# Patient Record
Sex: Female | Born: 1990 | Race: White | Hispanic: No
Health system: Western US, Academic
[De-identification: ages and names within clinical notes are randomized; demographics above are authoritative.]

## PROBLEM LIST (undated history)

## (undated) DIAGNOSIS — M79609 Pain in unspecified limb: Secondary | ICD-10-CM

## (undated) DIAGNOSIS — H6690 Otitis media, unspecified, unspecified ear: Secondary | ICD-10-CM

## (undated) DIAGNOSIS — Z8739 Personal history of other diseases of the musculoskeletal system and connective tissue: Secondary | ICD-10-CM

## (undated) DIAGNOSIS — T1490XA Injury, unspecified, initial encounter: Secondary | ICD-10-CM

## (undated) DIAGNOSIS — R42 Dizziness and giddiness: Secondary | ICD-10-CM

## (undated) DIAGNOSIS — M542 Cervicalgia: Secondary | ICD-10-CM

## (undated) DIAGNOSIS — M719 Bursopathy, unspecified: Secondary | ICD-10-CM

## (undated) DIAGNOSIS — R519 Headache, unspecified: Secondary | ICD-10-CM

## (undated) DIAGNOSIS — S79919A Unspecified injury of unspecified hip, initial encounter: Secondary | ICD-10-CM

## (undated) DIAGNOSIS — M1990 Unspecified osteoarthritis, unspecified site: Secondary | ICD-10-CM

## (undated) DIAGNOSIS — N939 Abnormal uterine and vaginal bleeding, unspecified: Secondary | ICD-10-CM

## (undated) DIAGNOSIS — Z87828 Personal history of other (healed) physical injury and trauma: Secondary | ICD-10-CM

## (undated) DIAGNOSIS — F419 Anxiety disorder, unspecified: Secondary | ICD-10-CM

## (undated) DIAGNOSIS — M2550 Pain in unspecified joint: Secondary | ICD-10-CM

## (undated) DIAGNOSIS — F4310 Post-traumatic stress disorder, unspecified: Secondary | ICD-10-CM

## (undated) DIAGNOSIS — R109 Unspecified abdominal pain: Secondary | ICD-10-CM

## (undated) DIAGNOSIS — G629 Polyneuropathy, unspecified: Secondary | ICD-10-CM

## (undated) DIAGNOSIS — IMO0002 Reserved for concepts with insufficient information to code with codable children: Secondary | ICD-10-CM

## (undated) DIAGNOSIS — M259 Joint disorder, unspecified: Secondary | ICD-10-CM

## (undated) DIAGNOSIS — J020 Streptococcal pharyngitis: Secondary | ICD-10-CM

## (undated) DIAGNOSIS — G8929 Other chronic pain: Secondary | ICD-10-CM

## (undated) DIAGNOSIS — M26609 Unspecified temporomandibular joint disorder, unspecified side: Secondary | ICD-10-CM

## (undated) DIAGNOSIS — H9319 Tinnitus, unspecified ear: Secondary | ICD-10-CM

## (undated) DIAGNOSIS — F32A Depression, unspecified: Secondary | ICD-10-CM

## (undated) DIAGNOSIS — M5127 Other intervertebral disc displacement, lumbosacral region: Secondary | ICD-10-CM

## (undated) DIAGNOSIS — N809 Endometriosis, unspecified: Secondary | ICD-10-CM

## (undated) DIAGNOSIS — N83209 Unspecified ovarian cyst, unspecified side: Secondary | ICD-10-CM

## (undated) HISTORY — DX: Personal history of other (healed) physical injury and trauma: Z87.828

## (undated) HISTORY — DX: Cervicalgia: M54.2

## (undated) HISTORY — PX: TRIGGER POINT INJECTION: SHX5117

## (undated) HISTORY — DX: Bursopathy, unspecified: M71.9

## (undated) HISTORY — DX: Other chronic pain: G89.29

## (undated) HISTORY — DX: Tinnitus, unspecified ear: H93.19

## (undated) HISTORY — PX: ORTHOPEDIC SURGERY: SHX5033

## (undated) HISTORY — DX: Personal history of other diseases of the musculoskeletal system and connective tissue: Z87.39

## (undated) HISTORY — DX: Pain in unspecified limb: M79.609

## (undated) HISTORY — DX: Dizziness and giddiness: R42

## (undated) HISTORY — PX: WISDOM TOOTH EXTRACTION: SHX5011

## (undated) HISTORY — DX: Otitis media, unspecified, unspecified ear: H66.90

## (undated) HISTORY — DX: Unspecified osteoarthritis, unspecified site: M19.90

## (undated) HISTORY — DX: Endometriosis, unspecified: N80.9

## (undated) HISTORY — DX: Depression, unspecified: F32.A

## (undated) HISTORY — DX: Unspecified temporomandibular joint disorder, unspecified side: M26.609

## (undated) HISTORY — DX: Polyneuropathy, unspecified: G62.9

## (undated) HISTORY — DX: Streptococcal pharyngitis: J02.0

## (undated) HISTORY — DX: Post-traumatic stress disorder, unspecified: F43.10

## (undated) HISTORY — DX: Joint disorder, unspecified: M25.9

## (undated) HISTORY — DX: Pain in unspecified joint: M25.50

## (undated) HISTORY — DX: Reserved for concepts with insufficient information to code with codable children: IMO0002

## (undated) HISTORY — DX: Other intervertebral disc displacement, lumbosacral region: M51.27

## (undated) HISTORY — DX: Headache, unspecified: R51.9

## (undated) HISTORY — DX: Unspecified ovarian cyst, unspecified side: N83.209

## (undated) HISTORY — DX: Unspecified injury of unspecified hip, initial encounter: S79.919A

## (undated) HISTORY — PX: NO PRIOR SURGERIES: 100

## (undated) HISTORY — PX: PR UNLISTED PROCEDURE PELVIS/HIP JOINT: 27299

## (undated) HISTORY — DX: Unspecified abdominal pain: R10.9

## (undated) HISTORY — DX: Abnormal uterine and vaginal bleeding, unspecified: N93.9

## (undated) HISTORY — DX: Anxiety disorder, unspecified: F41.9

## (undated) HISTORY — PX: PR ADENOIDECTOMY PRIMARY <AGE 12: 42830

## (undated) HISTORY — DX: Injury, unspecified, initial encounter: T14.90XA

## (undated) HISTORY — PX: PR TYMPANOSTOMY GENERAL ANESTHESIA: 69436

---

## 2018-10-13 ENCOUNTER — Ambulatory Visit: Payer: Self-pay

## 2018-11-29 DIAGNOSIS — M25552 Pain in left hip: Secondary | ICD-10-CM | POA: Insufficient documentation

## 2018-12-05 ENCOUNTER — Ambulatory Visit: Payer: Self-pay

## 2019-01-09 HISTORY — PX: HC ARTHROSCOPY HIP W/LABRAL REPAIR: 04901651

## 2019-01-30 DIAGNOSIS — M25559 Pain in unspecified hip: Secondary | ICD-10-CM | POA: Insufficient documentation

## 2019-01-30 DIAGNOSIS — M24159 Other articular cartilage disorders, unspecified hip: Secondary | ICD-10-CM | POA: Insufficient documentation

## 2020-10-30 DIAGNOSIS — M4727 Other spondylosis with radiculopathy, lumbosacral region: Secondary | ICD-10-CM | POA: Insufficient documentation

## 2020-12-03 DIAGNOSIS — M47816 Spondylosis without myelopathy or radiculopathy, lumbar region: Secondary | ICD-10-CM | POA: Insufficient documentation

## 2021-02-14 ENCOUNTER — Ambulatory Visit: Payer: Self-pay

## 2021-08-10 HISTORY — PX: MOUTH SURGERY: SHX715

## 2021-11-05 DIAGNOSIS — M545 Low back pain, unspecified: Secondary | ICD-10-CM | POA: Insufficient documentation

## 2021-11-05 DIAGNOSIS — N941 Unspecified dyspareunia: Secondary | ICD-10-CM | POA: Insufficient documentation

## 2021-11-05 DIAGNOSIS — R102 Pelvic and perineal pain: Secondary | ICD-10-CM | POA: Insufficient documentation

## 2021-11-06 ENCOUNTER — Telehealth (HOSPITAL_BASED_OUTPATIENT_CLINIC_OR_DEPARTMENT_OTHER): Payer: Self-pay | Admitting: Gynecology

## 2021-11-06 NOTE — Telephone Encounter (Signed)
New referral. Outside records requested

## 2021-11-10 ENCOUNTER — Telehealth (HOSPITAL_BASED_OUTPATIENT_CLINIC_OR_DEPARTMENT_OTHER): Payer: Self-pay

## 2021-11-10 NOTE — Telephone Encounter (Signed)
RETURN CALL: Voicemail - Detailed Message      SUBJECT:  Appointment Request     REASON FOR VISIT: 1cm mass on L buccal mucosa x4 mo  PREFERRED DATE/TIME: 1st Available   ADDITIONAL INFORMATION: ccr unable to appoint no scheduling instructions in referral, please call

## 2021-11-12 ENCOUNTER — Ambulatory Visit (HOSPITAL_BASED_OUTPATIENT_CLINIC_OR_DEPARTMENT_OTHER): Admitting: Physical Medicine & Rehabilitation

## 2021-11-15 ENCOUNTER — Encounter (HOSPITAL_BASED_OUTPATIENT_CLINIC_OR_DEPARTMENT_OTHER): Payer: Self-pay

## 2021-11-18 ENCOUNTER — Ambulatory Visit: Attending: Family Medicine | Admitting: Family Medicine

## 2021-11-18 ENCOUNTER — Encounter (HOSPITAL_BASED_OUTPATIENT_CLINIC_OR_DEPARTMENT_OTHER): Payer: Self-pay | Admitting: Family Medicine

## 2021-11-18 ENCOUNTER — Other Ambulatory Visit (HOSPITAL_COMMUNITY): Payer: Self-pay

## 2021-11-18 VITALS — BP 113/77 | HR 88 | Temp 98.1°F | Ht 66.54 in | Wt 178.0 lb

## 2021-11-18 DIAGNOSIS — G5703 Lesion of sciatic nerve, bilateral lower limbs: Secondary | ICD-10-CM | POA: Insufficient documentation

## 2021-11-18 DIAGNOSIS — M533 Sacrococcygeal disorders, not elsewhere classified: Secondary | ICD-10-CM | POA: Insufficient documentation

## 2021-11-18 DIAGNOSIS — M5459 Other low back pain: Secondary | ICD-10-CM | POA: Insufficient documentation

## 2021-11-18 DIAGNOSIS — M67959 Unspecified disorder of synovium and tendon, unspecified thigh: Secondary | ICD-10-CM | POA: Insufficient documentation

## 2021-11-18 DIAGNOSIS — M419 Scoliosis, unspecified: Secondary | ICD-10-CM | POA: Insufficient documentation

## 2021-11-18 NOTE — Patient Instructions (Signed)
Physical Therapy Locations:     [place]    [facility]   [address] [place] [address]  Phone: [phone]    [facility]   [address] [place] [address]  Phone: [phone]    [facility]  [address] [place] [address]  Phone: [phone]    [place]     [facility]  [address] [place] [address]  Phone: [phone]    [facility]   [address]  [place] [address]  Phone: [phone]    [facility]   [address]  [place] [address]    [place]     [facility]   [address] [place] [address]  Phone: [phone]

## 2021-11-18 NOTE — Progress Notes (Unsigned)
[facility] NOTE    ID & CHIEF COMPLAINT:   Patient: Christina Brooks  Date: 11/18/2021  Referred by: Dr. Outside   Primary Care Physician:  No primary care provider on file.    Chief Complaint   Patient presents with   . Lower Back - Pain       HISTORY:   Christina Brooks is a 31 year old female who is presenting with low back pain without acute injury but started years ago after being in the [employer]. Her pain stars in the low back and shoots down her leg into her toes on occasion. Pain worse with movement or activity. Sitting for extended periods of time also painful. She had lumbar MRI 2020 that showed herniated disc of L5-S1. MRI 10/2020 and SPECT scan 02/2021 that showed arthritis. Also has imaging in 2018 that showed scoliosis of spine. More recently any basic movement is ramping up pain. She has done PT for years with some improvements but not much. Treatments have included PT, oral steroid, injections into the spine but didn't help, gabapentin. She has not done PT in approximately 6 months. Has seen spine specialist with in [place]. Has been putting off surgery. Had SI joint injection last year that was very helpful. Went away after injection but recently started noticing it again. Also has history of PTSD and tries not to take medications.      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 Helped with the hip a lot.     Activities and exercise include walking. Medically retired from [employer]    Past relevant orthopedic history: I reviewed the patient's chart and history, and patient has no other orthopedic history relevant to today's encounter.    Past Medical History: I have reviewed and confirmed the past medical history in the chart.  No past medical history on file.    Past Surgical History: I have reviewed and confirmed the past surgical history in the chart.  No past surgical history on file.    Medications: Reviewed medication list in the chart.  No outpatient medications prior to visit.      No facility-administered medications prior to visit.       Allergies: Reviewed allergy section in the chart.  Review of patient's allergies indicates:  Not on File    Review of Systems: A 14-point review of systems was performed.  With the exception of the HPI, all other review of systems was negative.       PHYSICAL EXAM:   Vitals: BP 113/77   Pulse 88   Temp 36.7 C   Ht 5' 6.54" (1.69 m)   Wt 80.7 kg (178 lb)   SpO2 99%   BMI 28.27 kg/m  Body mass index is 28.27 kg/m.  General: NAD, pleasant & cooperative  Head: EOMI, no facial lesions  MSK:  Back Exam:  (Focus on bilateral lumbar back; the physical exam is normal on contralateral side except when noted)    Inspection:   No deformity, asymmetry, atrophy, redness, or ecchymosis. + paraspinous muscle spasms along the lumbar spine.  Postural abnormalities: No significant lordosis, kyphosis, or pelvic tilt. Mild scoliosis.     Palpation:    Minimal tenderness to palpation of the midline/spinous processes of the lumbar spine  Moderate paraspinal muscle tenderness  Mild tenderness to palpation of the bilateral sacroiliac joints  Moderate tenderness to palpation of bilateral quadratus lumborum    Range of motion:  Normal extension (20?), normal flexion (75?),  normal lateral side bending (20-30?) withMild pain elicited    Strength:  5/5 hip flexion (L2/3) bilaterally withNo pain elicited  5/5 hip adduction (L2/3) bilaterally withNo pain elicited  5/5 knee extension (L4) bilaterally withNo pain elicited  5/5 dorsiflexion (L4/5) bilaterally withNo pain elicited  5/5 foot plantarflexion (S1) bilaterally withNo pain elicited    Special tests:  Supine straight-leg raise Negative bilaterally  Seated slump test Negative bilaterally   Sphinx test Negative bilaterally  FABER test Negative bilaterally     Neurovascular:  Sensation intact to light touch throughout anteromedial thigh (L2/3), lateral thigh / anterior knee / medial lower leg (L4), lateral leg / dorsal foot  (L5), posterior lower leg / lateral foot (S1)  Patellar reflexes (L3, L4) 2+ bilaterally  Ankle jerk reflexes (S1, S2) 2+ bilaterally  Distal capillary refill <2 seconds    Gait normal    Hip Exam:  (Focus on bilateral hip; the physical exam is normal on contralateral side except when noted)    Visualization:  No gross deformities or atrophy. No swelling, bruising, atrophy, or erythema compared to contralateral side.     Palpation:  No tenderness to palpation of the ASIS  No tenderness to palpation of the AIIS  No tenderness to palpation of the pubic ramus  No tenderness to palpation of the iliopsoas tendon  No tenderness to palpation of the greater trochanter  Mild tenderness to palpation of the gluteus medius  No tenderness to palpation of the proximal IT band  No tenderness to palpation of the proximal hamstring tendon  No tenderness to palpation of the ischial tuberosity  Moderate tenderness to palpation of the piriformis  No tenderness to palpation of the quadratus femoris    Range of motion:  Internal rotation to 40 degrees (40 degrees normal) with No pain elicited  External rotation to 60 degrees (60 degrees normal) with No pain elicited  Flexion with straight knee to 80 degrees (80 degrees normal) with No pain elicited  Flexion with bent knee to 150 degrees (150 degrees normal) with No pain elicited  Abduction to 45 degrees (45 degrees normal) with No pain elicited  Adduction to 30 degrees (30 degrees normal) with No pain elicited    Strength:  5/5 hip flexion with straight knee with No pain elicited  5/5 hip flexion with bent knee with No pain elicited  5/5 hip adduction with No pain elicited  5/5 hip abduction with No pain elicited  5/5 external rotation with No pain elicited    Special tests:  FABER test: Negative  FADIR test: Negative  Femoroacetabular grind test: Negative  Snapping hip test: Negative  Thomas hip flexor test: Negative  Ober test: Negative    Neurovascular:  Sensation intact to light  touch throughout bilateral lower extremities  Neural slump test: Negative    Skin: No ulcers, erythema, or skin breakdown  Psych: Alert, appropriate affect      IMAGING:   I have personally reviewed the following imaging.     MRI lumbar spine:   Small disc bulge at L5-S1 without foraminal or spinal canal stenosis.       ASSESSMENT:    Christina Brooks is a 31 year old female who presents with chronic low back pain.       RECOMMENDATIONS & PLAN:   Given our clinical suspicion of the above diagnosis, our initial plan is as follows:    1. Mechanical low back pain  2. Piriformis syndrome of both sides  3. Tendinopathy  of gluteus medius  4. 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  History and exam are concerning and consistent for mechanical low back pain as well as tendinopathy of the gluteus musculature, piriformis, and dysfunction of the SI joints bilaterally.  She does have scoliosis noted on exam as well which likely is the cause of her mechanical issues and pain.  She does have an MRI of the lumbar spine which I did review.  It does show a small disc bulge but no stenosis.  I have reassured her that this is most likely not the cause of her pain.  I have also reassured her that surgery would  most likely not fix her pain at this point.  I also do not think she would benefit from oral or injectable steroids for her pain as well.  Rather, I think a good course of physical therapy to work on her mechanics, strength of her hips pelvis and core, and range of motion will be much more beneficial for her at this time.  I also do think she would be a good candidate for osteopathic manipulative treatment in the future.  Reassurance was provided today.  Because she did have improvement with SI joint injections, this is something we could also consider in the future as well.  - Referral to Physical Therapy -  Ortho/Sports/Musculoskeletal; Future      The above plan of care, diagnosis, orders, and follow-up were discussed with the patient.  Questions related to this recommended plan of care were answered. Patient agrees with the treatment plan.       FOLLOW-UP:   Return in about 3 months (around 02/17/2022) for repeat evaluation.      Caryn SectionLauren Jaimen Melone, DO, CAQSM  Acting Assistant Professor  Division of Sports Medicine   [facility]

## 2021-11-19 ENCOUNTER — Encounter (HOSPITAL_BASED_OUTPATIENT_CLINIC_OR_DEPARTMENT_OTHER): Payer: Self-pay

## 2021-11-24 NOTE — Progress Notes (Unsigned)
[facility]       [address] [place]  Address Alert - Do Not Mail              Dear  Dr. Tobey Bride,    We appreciate the opportunity to see Christina Brooks at the [place] [facility] in consultation for a buccal lesion.     As you are well aware of this patient's antecedent medical history I would only note that She is a pleasant 31 year old female patient that presents complaining of a left buccal lesion that has been present for 3 months.     Her past medical history is otherwise significant for ***.     On physical exam, the patient *** The rest of the exam is unremarkable..    In summary, Christina Brooks is a very pleasant 31 year old female patient with ***    There are no diagnoses linked to this encounter.    As always we appreciate the opportunity to see this patient at our center and I will keep you apprised of all progress as things move forward.    Sincerely,       Rennie Plowman MD  Assistant Professor  Otolaryngology - Head & Neck Surgery  Head &Neck Surgical Oncology/Microvascular Reconstruction  Cutaneous Malignancies  Thyroid & Parathyroid Surgery  Salivary Gland Disorders  Pager [phone]          {Vanishing Tip  [phone] E&M codes are now billed on either MDM or total time. You can document only what is medically necessary. :999}   No chief complaint on file.    {VT  History items are no longer counted. You can document only relevant history and ROS items  Problem List  Medical Hx  Surgical Hx  Family Hx  Substance & Sexual Hx  Social Documentation  Obstetric Hx   Birth Hx :999}      {VT  Physical exam items are no longer counted. You can document only relevant examination findings  Vitals Flowsheet  Labs  Imaging :999}     {VT  Problem List  Meds & Orders  HM  Care Gaps :999}   {VT  LOS Calculator  Job aid  AMA's MDM grid :999}        There were no vitals taken for this visit.    LAST IMAGING REVIEWED:  None    MOST  PERTINENT LABS/PATH REVIEWED:   None    PROCEDURES:  None***    ***The patient was verbally consented for FFL. 4% Lidocaine and Afrin spray were used to topically anesthetize the nasal cavity. The nasopharyngoscope was then inserted through the *** nares, and used to examine the nasal cavities, nasopharynx, oropharynx, supraglottis, glottis, subglottis, and piriform sinuses. Relevant findings: unremarkable FFL exam, normal and symmetric vocal cord movement. No masses, lesions, or ulcers noted.    MEDICATIONS     Outpatient Medications Prior to Visit   Medication Sig Dispense Refill   . diclofenac sodium 75 MG EC tablet Take 1 tablet (75 mg) by mouth as needed for pain.     Marland Kitchen levonorgestrel 52 mg (20 mcg/day) IUD Mirena       No facility-administered medications prior to visit.       ALLERGIES     Review of patient's allergies indicates:  No Known Allergies    PAST MEDICAL AND SURGICAL HISTORY   No past medical history on file.  Past Surgical History:  Procedure Laterality Date   . PR UNLISTED PROCEDURE PELVIS/HIP JOINT         Excluding the time spent performing the procedure, I spent a total of *** minutes for the patient's care on the date of the service.  {Vanishing Tip  When performed by provider on date of visit, these count toward total time   Chart review    History taking   Physical exam   Counseling, educating patient/family/caregiver   Orders    Referrals and communication with other providers (not separately reported)   Documentation   Care coordination (not separately reported)   Independent interpretation of results (not separately reported) and communicating results to patient/family/caregiver  :999}

## 2021-11-24 NOTE — H&P (View-Only) (Signed)
[facility]            Dear  Dr. Tobey Bride,    We appreciate the opportunity to see Christina Brooks at the [place] [facility] in consultation for a left buccal lesion.     As you are well aware of this patient's antecedent medical history I would only note that She is a pleasant 31 year old female patient that presents complaining of a left buccal lesion that has been present for 5 months.     She retired from [employer] last year, had a gap in [employer], did not seek care immediately due to this. Denies any trauma to the area. Denies any pain, otalgia, dysphagia, odynophagia, loose teeth. 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 Denies dry mouth or eyes. No h/o autoimmune disorders, mother with hypothyroidism. Not sure if she has had pain or enlargement specifically of her parotid gland but endorses L>R pain she relates to TMJ. No increased pain or sensitivity post-prandial.     She has not had any imaging done.     Her past medical history is otherwise significant for TMJ, PTSD, lumbar back pain.     On physical exam, the patient is sitting comfortably and breathing without effort. Her oral exam reveals pink and moist mucosa. Dentition intact. 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 Otalgic exam notable for some tympanosclerosis from prior ear tubes. CN II-XII intact and symmetric. No cervical adenopathy. The rest of the exam is unremarkable.    In summary, Christina Brooks is a very pleasant 31 year old female patient with a left buccal lesion that has been present for 5 months. She denies any associated pain or irritation of her parotid gland. Although this lesion appear benign it does warrant a biopsy. However, given the close association with the duct I would do this in the OR and at the same time perform a left parotid  sialodochoplasty. We discussed that there is a risk of ductal stenosis. We will schedule this surgery at our earliest mutually convenient date.     Christina Brooks was seen today for new patient.    Lesion of buccal mucosa        As always we appreciate the opportunity to see this patient at our center and I will keep you apprised of all progress as things move forward.    Sincerely,       Rennie Plowman MD  Assistant Professor  Otolaryngology - Head & Neck Surgery  Head &Neck Surgical Oncology/Microvascular Reconstruction  Cutaneous Malignancies  Thyroid & Parathyroid Surgery  Salivary Gland Disorders  Pager [phone]             Chief Complaint   Patient presents with   . New Patient                           BP 118/77   Pulse 97   Ht 5\' 6"  (1.676 m)   SpO2 96%   BMI 28.73 kg/m     LAST IMAGING REVIEWED:  None    MOST PERTINENT LABS/PATH REVIEWED:   None    PROCEDURES:  None    MEDICATIONS     Outpatient Medications Prior to Visit   Medication Sig Dispense Refill   . diclofenac sodium 75 MG  EC tablet Take 1 tablet (75 mg) by mouth as needed for pain.     Marland Kitchen levonorgestrel 52 mg (20 mcg/day) IUD Mirena       No facility-administered medications prior to visit.       ALLERGIES     Review of patient's allergies indicates:  Allergies   Allergen Reactions   . Lexapro [Escitalopram] Skin: Rash   . Strawberry Extract DP:OEUMPN/TIRWERXV and GI: Pain/diarrhea       PAST MEDICAL AND SURGICAL HISTORY   No past medical history on file.  Past Surgical History:   Procedure Laterality Date   . PR UNLISTED PROCEDURE PELVIS/HIP JOINT         Excluding the time spent performing the procedure, I spent a total of 45 minutes for the patient's care on the date of the service.

## 2021-11-25 ENCOUNTER — Telehealth (HOSPITAL_BASED_OUTPATIENT_CLINIC_OR_DEPARTMENT_OTHER): Payer: Self-pay

## 2021-11-25 NOTE — Telephone Encounter (Signed)
RETURN CALL: Voicemail - Detailed Message      SUBJECT:  Referral Request/Questions      REASON FOR REFERRAL: Back   NAME OF CLINIC/SPECIALTY: [facility]   PROVIDER: Moise Boring   PHONE: [phone]  FAX: N/A   ADDITIONAL INFORMATION: Patient sees PT, Moise Boring, for other related conditions and will like to continue care with the PT for the condition she is having

## 2021-11-25 NOTE — Telephone Encounter (Signed)
Spoke with patient. Faxing over PT referral to [facility].     Fax number: [phone]  Clinic line: [phone]    Thank you,  Monico Hoar PSS  [facility] [place] [facility]   Phone: [phone] Option 2

## 2021-11-25 NOTE — Telephone Encounter (Signed)
RETURN CALL: Voicemail - Detailed Message      SUBJECT:  General Message     MESSAGE: Spoke with patient who is concern if referral from [facility] in [place] has been received, CCR unable to locate the referral and did inform patient if referral is from an external provider and not from a Neurologist to contact referring provider for alternative options

## 2021-11-25 NOTE — Telephone Encounter (Signed)
LVM for patient to call us back so we can gather more information on what exactly she needs from our clinic.     Thank you,   Carolin Sicks PSS  [facility]   Phone: [phone] Option 2

## 2021-11-27 ENCOUNTER — Ambulatory Visit: Attending: Otolaryngology | Admitting: Otolaryngology

## 2021-11-27 VITALS — BP 118/77 | HR 97 | Ht 66.0 in

## 2021-11-27 DIAGNOSIS — K1370 Unspecified lesions of oral mucosa: Secondary | ICD-10-CM | POA: Insufficient documentation

## 2021-12-01 NOTE — Anesthesia Preprocedure Evaluation (Addendum)
Patient: Christina Brooks    Procedure Information     Date/Time: 12/03/21 1305    Procedure: LEFT PAROTID SIALOENDOSCOPY AND WIDE LOCAL EXCISION MOUTH (Left: Mouth)    Location: [place] MAIN OR 13 / [place] [facility] OR    Surgeons: Lorelee Market, MD        HPI:   11/27/21 [facility]  She is a pleasant 31 year old female patient that presents complaining of a left buccal lesion that has been present for 5 months.     Relevant Problems   Otolaryngology   (+) Lesion of buccal mucosa     Relevant surgical history:   Past Surgical History:   Procedure Laterality Date   . PR UNLISTED PROCEDURE PELVIS/HIP JOINT           Medications:     Outpatient:   Current Outpatient Medications   Medication Instructions   . diclofenac sodium (VOLTAREN) 75 mg, Oral, As needed   . levonorgestrel 52 mg (20 mcg/day) IUD Mirena                Review of patient's allergies indicates:  Allergies   Allergen Reactions   . Lexapro [Escitalopram] Skin: Rash   . Strawberry Extract JJ:HERDEY/CXKGYJEH and GI: Pain/diarrhea       Social History:   Social History     Tobacco Use   . Smoking status: Never   . Smokeless tobacco: Never       Medical History and Review of Systems  Documentation reviewed: patient health questionnaire and electronic medical record.    Source of information: Chart review.  Previous anesthesia: Yes   History of anesthetic complications  (-) History of anesthetic complications.  (-) family history of anesthetic complications.      Functional Status   Able to walk 1 city block (200 yards), able to climb 1 flight of stairs without stopping, able to climb 2 flights of stairs or more without stopping, exercise regularly and able to lay flat and still for 30 minutes.   Functional status comments:   ~walking 3x week      Pulmonary   Neg pulmonary ROS    Neuro/Psych   (+) psychiatric history (PTSD)    Cardiovascular   Neg cardio ROS    HEENT   ~left buccal lesion, present x5 months    (+) TMJ    Musculoskeletal   Hx herniated  disc, lumbar back pain      (+) osteoarthritis (spine)    Skin   negative skin ROS    GI/Hepatic/Renal   neg GI/hepatic/renal ROS    Endo/Immunology   neg endo/other ROS    Hematology   negative hematology ROS  Oncology   negative hematology/oncology ROS          11/27/21  BP 118/77   Pulse 97   Ht 5\' 6"  (1.676 m)   SpO2 96%   BMI 28.73 kg/m    Physical Exam  Airway  Mallampati:  II  TM distance:  >6 cm  Neck ROM:  Full  Mouth Opening:  Normal    Dental  normal      Cardiovascular  Rhythm:  Regular    Pulmonary  Breath sounds clear to auscultation             Labs: (last year)   No labs identified within the last year      Relevant procedures / diagnostic studies:     No results found for: DIAGNOSIS  PAT CLINIC DISCUSSION    ANESTHESIA PLAN   Informed Consent:     Anesthesia Plan discussed with:        Patient    ASA Score:     ASA: 2  Planned Anesthetic Type:      general      Risk Calculators / Scores:     PONV: Intermediate Risk  Total Score: 2            Female patient     Non-smoker        Criteria that do not apply:    History of PONV    History of motion sickness    Intended opioid administration

## 2021-12-02 ENCOUNTER — Telehealth (HOSPITAL_COMMUNITY): Payer: Self-pay

## 2021-12-02 ENCOUNTER — Other Ambulatory Visit (HOSPITAL_BASED_OUTPATIENT_CLINIC_OR_DEPARTMENT_OTHER): Payer: Self-pay | Admitting: Gynecology

## 2021-12-02 ENCOUNTER — Encounter (HOSPITAL_BASED_OUTPATIENT_CLINIC_OR_DEPARTMENT_OTHER): Payer: Self-pay | Admitting: Gynecology

## 2021-12-02 ENCOUNTER — Encounter (HOSPITAL_BASED_OUTPATIENT_CLINIC_OR_DEPARTMENT_OTHER): Payer: Self-pay | Admitting: Student in an Organized Health Care Education/Training Program

## 2021-12-02 ENCOUNTER — Ambulatory Visit: Attending: Gynecology | Admitting: Gynecology

## 2021-12-02 ENCOUNTER — Ambulatory Visit (HOSPITAL_BASED_OUTPATIENT_CLINIC_OR_DEPARTMENT_OTHER)

## 2021-12-02 VITALS — BP 127/82 | HR 80 | Ht 66.0 in | Wt 178.0 lb

## 2021-12-02 DIAGNOSIS — N926 Irregular menstruation, unspecified: Secondary | ICD-10-CM | POA: Insufficient documentation

## 2021-12-02 DIAGNOSIS — Z975 Presence of (intrauterine) contraceptive device: Secondary | ICD-10-CM | POA: Insufficient documentation

## 2021-12-02 DIAGNOSIS — N9489 Other specified conditions associated with female genital organs and menstrual cycle: Secondary | ICD-10-CM | POA: Insufficient documentation

## 2021-12-02 DIAGNOSIS — N941 Unspecified dyspareunia: Secondary | ICD-10-CM | POA: Insufficient documentation

## 2021-12-02 DIAGNOSIS — N946 Dysmenorrhea, unspecified: Secondary | ICD-10-CM | POA: Insufficient documentation

## 2021-12-02 LAB — NWH ONLY: BACT. VAGINOSIS SCREEN (SPECIAL SWAB REQUIRED): Bact. Vaginosis Screen Result: NEGATIVE

## 2021-12-02 LAB — PROLACTIN: Prolactin: 14 ng/mL

## 2021-12-02 LAB — PROGESTERONE: Progesterone: 0.4 ng/mL

## 2021-12-02 LAB — FOLLICLE STIMULATING HORMONE: Follicle Stimulating Hormone: 9 m[IU]/mL

## 2021-12-02 LAB — THYROID STIMULATING HORMONE: Thyroid Stimulating Hormone: 2.438 u[IU]/mL (ref 0.400–5.000)

## 2021-12-02 LAB — ESTRADIOL: Estradiol: 35 pg/mL

## 2021-12-02 MED ORDER — ELAGOLIX SODIUM 200 MG OR TABS
200.0000 mg | ORAL_TABLET | Freq: Two times a day (BID) | ORAL | 11 refills | Status: DC
Start: 2021-12-02 — End: 2022-04-06

## 2021-12-02 NOTE — Telephone Encounter (Signed)
Pre-Surgery Med List Interview, Pharm Tech
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
## 2021-12-02 NOTE — Telephone Encounter (Signed)
Updating med list prior to appointment.

## 2021-12-03 ENCOUNTER — Ambulatory Visit
Admission: RE | Admit: 2021-12-03 | Discharge: 2021-12-03 | Disposition: A | Discharge status: Home / Self Care | Attending: Otolaryngology | Admitting: Otolaryngology

## 2021-12-03 ENCOUNTER — Ambulatory Visit (HOSPITAL_BASED_OUTPATIENT_CLINIC_OR_DEPARTMENT_OTHER): Admitting: Anesthesiology

## 2021-12-03 ENCOUNTER — Ambulatory Visit (HOSPITAL_COMMUNITY): Admitting: Anesthesiology

## 2021-12-03 ENCOUNTER — Ambulatory Visit (HOSPITAL_COMMUNITY): Payer: Self-pay | Admitting: Otolaryngology

## 2021-12-03 ENCOUNTER — Other Ambulatory Visit (HOSPITAL_BASED_OUTPATIENT_CLINIC_OR_DEPARTMENT_OTHER): Payer: Self-pay

## 2021-12-03 ENCOUNTER — Encounter (HOSPITAL_COMMUNITY)
Admission: RE | Disposition: A | Discharge status: Home / Self Care | Payer: Self-pay | Source: Home / Self Care | Attending: Otolaryngology

## 2021-12-03 ENCOUNTER — Encounter (HOSPITAL_COMMUNITY): Payer: Self-pay | Admitting: Otolaryngology

## 2021-12-03 DIAGNOSIS — Z975 Presence of (intrauterine) contraceptive device: Secondary | ICD-10-CM | POA: Insufficient documentation

## 2021-12-03 DIAGNOSIS — M479 Spondylosis, unspecified: Secondary | ICD-10-CM | POA: Insufficient documentation

## 2021-12-03 DIAGNOSIS — F4310 Post-traumatic stress disorder, unspecified: Secondary | ICD-10-CM | POA: Insufficient documentation

## 2021-12-03 DIAGNOSIS — K118 Other diseases of salivary glands: Secondary | ICD-10-CM

## 2021-12-03 DIAGNOSIS — M26609 Unspecified temporomandibular joint disorder, unspecified side: Secondary | ICD-10-CM | POA: Insufficient documentation

## 2021-12-03 DIAGNOSIS — Z888 Allergy status to other drugs, medicaments and biological substances status: Secondary | ICD-10-CM | POA: Insufficient documentation

## 2021-12-03 LAB — GLUCOSE POC, ~~LOC~~: Glucose (POC): 104 mg/dL (ref 62–125)

## 2021-12-03 SURGERY — SIALENDOSCOPY
Anesthesia: General | Site: Mouth | Laterality: Left | Wound class: Class II/ Clean Contaminated

## 2021-12-03 MED ORDER — NEOSTIGMINE METHYLSULFATE 5 MG/10ML IV SOLN
INTRAVENOUS | Status: DC | PRN
Start: 2021-12-03 — End: 2021-12-03
  Administered 2021-12-03: 2.5 mg via INTRAVENOUS

## 2021-12-03 MED ORDER — SCOPOLAMINE 1 MG/3DAYS TD PT72
MEDICATED_PATCH | TRANSDERMAL | Status: DC | PRN
Start: 2021-12-03 — End: 2021-12-03
  Administered 2021-12-03: 1 via TRANSDERMAL

## 2021-12-03 MED ORDER — ONDANSETRON HCL 4 MG/2ML IJ SOLN
INTRAMUSCULAR | Status: AC
Start: 2021-12-03 — End: 2021-12-03
  Administered 2021-12-03: 4 mg via INTRAVENOUS
  Filled 2021-12-03: qty 2

## 2021-12-03 MED ORDER — ROCURONIUM BROMIDE 50 MG/5ML IV SOLN
INTRAVENOUS | Status: DC | PRN
Start: 2021-12-03 — End: 2021-12-03
  Administered 2021-12-03: 40 mg via INTRAVENOUS

## 2021-12-03 MED ORDER — DEXAMETHASONE SOD PHOSPHATE PF 10 MG/ML IJ SOLN
INTRAMUSCULAR | Status: DC | PRN
Start: 2021-12-03 — End: 2021-12-03
  Administered 2021-12-03: 8 mg via INTRAVENOUS

## 2021-12-03 MED ORDER — GLYCOPYRROLATE 0.2 MG/ML IJ SOLN
INTRAMUSCULAR | Status: DC | PRN
Start: 2021-12-03 — End: 2021-12-03
  Administered 2021-12-03: .4 mg via INTRAVENOUS

## 2021-12-03 MED ORDER — FENTANYL CITRATE (PF) 50 MCG/ML IJ SOLN WRAPPER (ANESTHESIA OSM ONLY)
INTRAMUSCULAR | Status: DC | PRN
Start: 2021-12-03 — End: 2021-12-03
  Administered 2021-12-03 (×2): 50 ug via INTRAVENOUS

## 2021-12-03 MED ORDER — PROPOFOL 200 MG/20ML IV EMUL
INTRAVENOUS | Status: AC
Start: 2021-12-03 — End: 2021-12-03
  Filled 2021-12-03: qty 20

## 2021-12-03 MED ORDER — LACTATED RINGERS IV SOLN
100.0000 mL/h | INTRAVENOUS | Status: DC
Start: 2021-12-03 — End: 2021-12-03

## 2021-12-03 MED ORDER — STERILE WATER FOR IRRIGATION IR SOLN
Status: DC | PRN
Start: 2021-12-03 — End: 2021-12-03
  Administered 2021-12-03: 1000 mL

## 2021-12-03 MED ORDER — DEXAMETHASONE SOD PHOSPHATE PF 10 MG/ML IJ SOLN
INTRAMUSCULAR | Status: AC
Start: 2021-12-03 — End: 2021-12-03
  Filled 2021-12-03: qty 1

## 2021-12-03 MED ORDER — MECLIZINE HCL 25 MG OR TABS
25.0000 mg | ORAL_TABLET | Freq: Once | ORAL | Status: AC
Start: 2021-12-03 — End: 2021-12-03
  Administered 2021-12-03: 25 mg via ORAL
  Filled 2021-12-03: qty 1

## 2021-12-03 MED ORDER — LIDOCAINE HCL (PF) 2 % IJ SOLN
INTRAMUSCULAR | Status: AC
Start: 2021-12-03 — End: 2021-12-03
  Filled 2021-12-03: qty 5

## 2021-12-03 MED ORDER — PROPOFOL 10 MG/ML IV EMUL WRAPPER (OSM ONLY)
INTRAVENOUS | Status: DC | PRN
Start: 2021-12-03 — End: 2021-12-03
  Administered 2021-12-03: 50 mg via INTRAVENOUS
  Administered 2021-12-03: 150 mg via INTRAVENOUS
  Administered 2021-12-03: 50 mg via INTRAVENOUS
  Administered 2021-12-03: 125 ug/kg/min via INTRAVENOUS

## 2021-12-03 MED ORDER — SCOPOLAMINE 1 MG/3DAYS TD PT72
MEDICATED_PATCH | TRANSDERMAL | Status: AC
Start: 2021-12-03 — End: 2021-12-03
  Filled 2021-12-03: qty 1

## 2021-12-03 MED ORDER — ACETAMINOPHEN 500 MG OR TABS
1000.0000 mg | ORAL_TABLET | Freq: Four times a day (QID) | ORAL | Status: DC
Start: 2021-12-03 — End: 2021-12-03
  Administered 2021-12-03: 1000 mg via ORAL
  Filled 2021-12-03: qty 2

## 2021-12-03 MED ORDER — NEOSTIGMINE METHYLSULFATE 5 MG/10ML IV SOLN
INTRAVENOUS | Status: AC
Start: 2021-12-03 — End: 2021-12-03
  Filled 2021-12-03: qty 10

## 2021-12-03 MED ORDER — CHLORHEXIDINE GLUCONATE 0.12 % MT SOLN
15.0000 mL | Freq: Three times a day (TID) | OROMUCOSAL | 0 refills | Status: DC
Start: 2021-12-03 — End: 2022-01-27
  Filled 2021-12-03: qty 473, 11d supply, fill #0

## 2021-12-03 MED ORDER — ONDANSETRON HCL 4 MG/2ML IJ SOLN
4.0000 mg | INTRAMUSCULAR | Status: DC | PRN
Start: 2021-12-03 — End: 2021-12-03

## 2021-12-03 MED ORDER — GLYCOPYRROLATE 0.2 MG/ML IJ SOLN
INTRAMUSCULAR | Status: AC
Start: 2021-12-03 — End: 2021-12-03
  Filled 2021-12-03: qty 1

## 2021-12-03 MED ORDER — SODIUM CHLORIDE 0.9 % IV SOLN
3.0000 g | INTRAVENOUS | Status: DC
  Filled 2021-12-03: qty 8

## 2021-12-03 MED ORDER — LABETALOL HCL 5 MG/ML IV SOLN
5.0000 mg | INTRAVENOUS | Status: DC | PRN
Start: 2021-12-03 — End: 2021-12-03

## 2021-12-03 MED ORDER — ONDANSETRON HCL 4 MG/2ML IJ SOLN
4.0000 mg | Freq: Once | INTRAMUSCULAR | Status: AC
Start: 2021-12-03 — End: 2021-12-03

## 2021-12-03 MED ORDER — HYDROMORPHONE HCL 1 MG/ML IJ SOLN
INTRAMUSCULAR | Status: AC
Start: 2021-12-03 — End: 2021-12-03
  Filled 2021-12-03: qty 1

## 2021-12-03 MED ORDER — NALOXONE HCL 0.4 MG/ML IJ SOLN
0.0400 mg | INTRAMUSCULAR | Status: DC | PRN
Start: 2021-12-03 — End: 2021-12-03

## 2021-12-03 MED ORDER — PROPOFOL 500 MG/50ML IV EMUL INFUSION
INTRAVENOUS | Status: AC
Start: 2021-12-03 — End: 2021-12-03
  Filled 2021-12-03: qty 50

## 2021-12-03 MED ORDER — FENTANYL CITRATE (PF) 100 MCG/2ML IJ SOLN
INTRAMUSCULAR | Status: AC
Start: 2021-12-03 — End: 2021-12-03
  Filled 2021-12-03: qty 2

## 2021-12-03 MED ORDER — OXYCODONE HCL 5 MG OR TABS
5.0000 mg | ORAL_TABLET | ORAL | Status: DC | PRN
Start: 2021-12-03 — End: 2021-12-03

## 2021-12-03 MED ORDER — LACTATED RINGERS BOLUS
500.0000 mL | Freq: Once | INTRAVENOUS | Status: DC | PRN
Start: 2021-12-03 — End: 2021-12-03

## 2021-12-03 MED ORDER — LIDOCAINE HCL 2 % IJ SOLN
INTRAMUSCULAR | Status: DC | PRN
Start: 2021-12-03 — End: 2021-12-03
  Administered 2021-12-03: 80 mg via INTRAVENOUS

## 2021-12-03 MED ORDER — AMPICILLIN-SULBACTAM 3 G IN NS 100 ML IVPB MB-PLUS (SIMPLE)
INJECTION | Status: DC | PRN
Start: 2021-12-03 — End: 2021-12-03
  Administered 2021-12-03: 3 g via INTRAVENOUS

## 2021-12-03 MED ORDER — FENTANYL CITRATE (PF) 100 MCG/2ML IJ SOLN
12.5000 ug | INTRAMUSCULAR | Status: DC | PRN
Start: 2021-12-03 — End: 2021-12-03

## 2021-12-03 MED ORDER — ROCURONIUM BROMIDE 50 MG/5ML IV SOLN
INTRAVENOUS | Status: AC
Start: 2021-12-03 — End: 2021-12-03
  Filled 2021-12-03: qty 5

## 2021-12-03 MED ORDER — OXYCODONE HCL 5 MG OR TABS
5.0000 mg | ORAL_TABLET | ORAL | 0 refills | Status: DC | PRN
Start: 2021-12-03 — End: 2022-01-27
  Filled 2021-12-03: qty 10, 1d supply, fill #0

## 2021-12-03 MED ORDER — LACTATED RINGERS IV SOLN
INTRAVENOUS | Status: DC | PRN
Start: 2021-12-03 — End: 2021-12-03

## 2021-12-03 MED ORDER — GLYCOPYRROLATE 0.2 MG/ML IJ SOLN
0.2000 mg | INTRAMUSCULAR | Status: DC | PRN
Start: 2021-12-03 — End: 2021-12-03

## 2021-12-03 MED ORDER — HYDROMORPHONE HCL 2 MG/ML IJ SOLN
INTRAMUSCULAR | Status: DC | PRN
Start: 2021-12-03 — End: 2021-12-03
  Administered 2021-12-03: .6 mg via INTRAVENOUS
  Administered 2021-12-03: .4 mg via INTRAVENOUS

## 2021-12-03 MED ORDER — DEXAMETHASONE SODIUM PHOSPHATE 4 MG/ML IJ SOLN
10.0000 mg | INTRAMUSCULAR | Status: DC
Start: 2021-12-03 — End: 2021-12-03

## 2021-12-03 SURGICAL SUPPLY — 37 items
APPLICATOR COTTON TIP 3IN STERILE F/OR  50/PK (Other) ×2 IMPLANT
BALLOON .7MM (Catheter) IMPLANT
BLADE SURG BARD-PARKER CARBON STEEL 11 SAFETYLOCK (Blade) ×2 IMPLANT
CAP DEAD END MALE/FEMALE (Other) ×2 IMPLANT
CLIP HORIZON MEDIUM 6EA/PK (Laparoscopic) IMPLANT
CLIP HORIZON TI SM RED LIGATE 24EA/PK (Laparoscopic) IMPLANT
DRAIN 3/8IN FULL FLUTE FLAT (Drain) IMPLANT
DRAPE IOBAN 60CM X 45CM (Drape) ×2 IMPLANT
ELECTRODE PATIENT RETURN POLYHESVIE II REM W/ CORD (Other) ×2 IMPLANT
ELECTRODE PLUMEPEN ELITE ESU SMOKE EVACUATION PENCIL (Cautery) ×1 IMPLANT
ELECTRODE VALLEYLAB 3CM 45D .0023IN HEAT RESIST (Cautery) ×2 IMPLANT
EVACUATOR LTWT LOW LEVEL SUCTION STERILE LF DISP (Drain) IMPLANT
EXTRACT STONE .4MM 4 WIRE BASKET HANDLE (Other) IMPLANT
EXTRACT STONE .6MM 4 WIRE TIPLESS HANDLE (Other) IMPLANT
GLOVE SURG 6.5 BIOGEL MICRO INDICATOR PF (Glove) ×2 IMPLANT
GLOVE SURG POLYISOPRENE 6.5 BIOGEL PF (Glove) ×2 IMPLANT
GUIDEWIRE .4MM (Guidewire) ×1 IMPLANT
GUIDEWIRE .6MM (Guidewire) IMPLANT
IV TUBING EXTENSION 23IN  LL SLIDE 3.0ML (Tubing) ×2 IMPLANT
LOOP VESSEL THK.9MM MINI 16INX1.3MM LF BLUE (Other) IMPLANT
LUBRICANT PETROLEUM 2OZ WATER SOLUBLE STERILE (Other) IMPLANT
NEEDLE HYPODERMIC 27GA 1.5IN (Needle) ×2 IMPLANT
PACK CUSTOM HEAD NECK (Pack) ×2 IMPLANT
PACK GOWN 4 PACK W/TOWELS (Gown) ×2 IMPLANT
PLUMEPEN ESU SMOKE EVACUATION PENCIL (Cautery) ×2
PROTECT EYE OPTI-GARD EXTRA CUSHION SELF ADHERE (Other) ×2 IMPLANT
STENT SALIVARY DUCT WALVEKAR W/GUIDWIRE 1MM 100MM (Stent) ×1 IMPLANT
STOPCOCK MEDEX SMALL 3WAY SWIVEL MALE LUER (Other) IMPLANT
SUTURE MONOSOF 3-0 P-14 18IN 36/BX (Suture) ×2 IMPLANT
SUTURE POLYSORB 3-0 V-20 18IN UNDYED (Suture) IMPLANT
SUTURE POLYSORB 5-0 P-13 18IN UNDYED 12/BX (Suture) IMPLANT
SUTURE SOFSILK 2-0 18IN BLACK (Suture) IMPLANT
SUTURE SOFSILK 3-0 18IN BLACK (Suture) IMPLANT
SYRINGE 10ML LL (Syringe) ×4 IMPLANT
SYRINGE 30ML LL (Syringe) ×2 IMPLANT
SYRINGE 50ML LL (Syringe) ×2 IMPLANT
TOWEL SURG PACK (Towel) ×4 IMPLANT

## 2021-12-03 NOTE — Interval H&P Note (Signed)
The linked note is still valid and up to date; no changes are required.     General: lying in bed, in no acute distress  Head: atraumatic, normocephalic  Eyes: EOMI, sclera anicteric  Nose: no rhinorrhea   Mouth: MMM  Neck: soft, flat, ROMI  Extremities: warm  Neurologic: alert and oriented  Cardiac: RRR  Resp: Lungs CTAB

## 2021-12-03 NOTE — Anesthesia Postprocedure Evaluation (Signed)
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    Place of evaluation: PACU    Patient participation: patient participated    Level of consciousness: fully conscious    Patient pain control satisfaction: patient is satisfied with level of pain control    Airway patency: patent    Cardiovascular status during assessment: stable    Respiratory status during assessment: breathing comfortably    Anesthetic complications: no    Intravascular volume status assessment: euvolemic    Nausea / vomiting: patient is not experiencing nausea      Planned post-operative disposition at time of assessment: hospital discharge

## 2021-12-03 NOTE — Procedure Nursing Note (Signed)
Patient meeting PACU discharge criteria and has been signed out by anesthesia.  VSS.  Tolerating PO food and fluids.  Pain well controlled.  Up to void.  DC teaching completed with patient and husband and husband picked up medications from pharmacy.  Advised to take tylenol (every 6 hours) alternating with ibuprofen every 6 hours and given time of last doses on discharge paperwork.  Advised to call MD to schedule a follow up if she has not heard anything by early next week as she needs to have the buccal stent removed.

## 2021-12-03 NOTE — Op Note (Addendum)
DATE OF SURGERY: 12/03/21     PREOPERATIVE DIAGNOSIS:  1. Left buccal lesion      POSTOPERATIVE DIAGNOSIS:  1. Same     SURGICAL PROCEDURE:  1. Left parotid sialoendoscopy   2. Wide local excision left buccal lesion, measuring 1 x 2 cm      SURGEONS:  Wilmon Pali, MD    RESIDENT SURGEON:   1. Ezequiel Essex, MD      ANESTHESIA:  General endotracheal.     ESTIMATED BLOOD LOSS:  2 cc.     DRAINS:  None.      SPECIMENS:  1. Left buccal lesion      COMPLICATIONS:  None apparent.      FINDINGS:  1. Smooth, submucosal lesion immediately inferior and adjacent to the left Stensen's duct  2. Salivary stent left in the left Stensen's duct      INDICATIONS:  Christina Brooks is a 31 year old female who presented a recent history of a left buccal lesion. A biopsy was indicated. However, the lesion was closely associated with Stensen's duct and for that reason was performed int he operating room. The risks, benefits, and alternatives of surgery were discussed including but not limited to bleeding, infection, damage to nearby structures including the duct and need for further surgery. Informed consent was obtained.     DESCRIPTION OF SURGICAL PROCEDURE:  On the day of the surgery, the patient was brought to the operating suite. A robust time out was performed. The patient was orally intubated by our anesthesia colleagues without incident. The table was turned 180 degrees. The patient was prepped and draped in the usual fashion.     A bite block was placed. A #1, 2, 3 and 4 dilator was placed to serially dilate the left Stensen's duct. The 1.3 mm endoscope was placed and the duct was visualized. A 0.4 mm guide wire was placed through the working channel. The endoscope was removed. A hood salivary stent was placed over the guidewire and the guidewire was removed. Next, the lesion was excised with monopolar cautery with close circumferential margins and sent for permanent pathology. The Stent was secured to the mucosa  with 5-0 Vicryl. The wound was closed with interrupted 3-0 Vicryl. This concluded the procedure.      DISPOSITION:  On the day of the surgery, the patient was brought to the operating suite. A robust time out was performed.      ATTESTATION: America Brown, MD, was present for the entire procedure from insertion to removal of the scope.

## 2021-12-03 NOTE — Discharge Instructions (Addendum)
- You have a stent and a few stitches in the mouth.     - Keep mouth clean by rinsing after meals and using medicated mouthwash three times a day. Switch to regular mouthwash after one week.     - Soft diet for 3 days. Then advance as tolerated.           Scopolamine Patch    What it is and How to Use it  What is scopolamine?  Scopolamine is a medicine that prevents nausea and vomiting. It is often used to prevent the nausea and vomiting that can occur after having general anesthesia. Scopolamine makes the nerves that cause the vomiting reflex less active.  How to Use the Scopolamine Patch   Scopolamine comes as a round patch with 1 sticky side. It is placed on the skin behind the ear. It takes a few hours for the scopolamine to work, since it has to go through the skin.   Important Precautions  After you touch or remove the patch , be sure to wash your hands thoroughly with soap and water to remove any scopolamine from them. If this drug comes into contact with your eyes, it could cause short-term blurry vision and dilation (widening) of your pupils (the dark circles in the center of your eyes). Unless you also have eye pain and the whites of your eyes get red, this is not serious. Your pupils should return to normal.   When you throw out a used patch, it will still have active medicine in it. Follow these safety steps:  Fold the patch in half with the sticky side together.   Place it in a child-protected, covered trash can, out of the reach of children and pets.   Wearing the Patch After Surgery  Keep the patch in place for at least 24 hours after surgery. At that time, if you do not have nausea, you may remove it and throw it away. If you still have nausea, you may keep the patch on for up to 3 days.   It is OK to shower while you are wearing the patch, but it may fall off if it gets too wet. If the patch does fall off, throw it away (follow the precautions in the section above).  Side Effects  The most common  side effects of the scopolamine patch are:  Dry mouth  Drowsiness  Blurry vision or enlarged pupils (this usually occurs if you have scopolamine on your fingers and then touch your eye)  Very rarely, the patch may cause confusion, agitation, and mood or behavior changes. This is more common in the elderly.  Call the number below if you have:  Difficulty urinating (remove the patch)  Pain and reddening of your eyes, with enlarged pupils (remove the patch)  Skin rash  Change in your heart rate or rhythm  Severe constipation    Reviewed 05/2017 Discharge Instructions: After Your Surgery  You've just had surgery. During surgery, you were given medicine called anesthesia to keep you relaxed and free of pain. After surgery, you may have some pain or nausea. This is common. Here are some tips for feeling better and getting well after surgery.        Going home  Your healthcare provider will show you how to take care of yourself when you go home. He or she will also answer your questions. Have an adult family member or friend drive you home. For the first 24 hours after your surgery  and while taking narcotic pain medication:  Don't drive or use heavy equipment.  Don't make important decisions or sign legal papers.  Don't drink alcohol.  Have someone stay with you, if needed. He or she can watch for problems and help keep you safe.  Be sure to go to all follow-up visits with your healthcare provider. And rest after your surgery for as long as your healthcare provider tells you to.  Coping with pain  If you have pain after surgery, pain medicine will help you feel better. Take it as told, before pain becomes severe. Also, ask your healthcare provider or pharmacist about other ways to control pain. This might be with heat, ice, or relaxation. And follow any other instructions your surgeon or nurse gives you.  Tips for taking pain medicine  To get the best relief possible, remember these points:  Pain medicines can upset your  stomach. Taking them with a little food may help.  Most pain relievers taken by mouth need at least 20 to 30 minutes to start to work.  Don't wait till your pain becomes severe before you take your medicine. Try to time your medicine so that you can take it before starting an activity. This might be before you get dressed, go for a walk, or sit down for dinner.  Constipation is a common side effect of pain medicines. Call your healthcare provider before taking any medicines such as laxatives or stool softeners to help ease constipation. Also ask if you should skip any foods. Drinking lots of fluids and eating foods such as fruits and vegetables that are high in fiber can also help. Remember, don't take laxatives unless your surgeon has prescribed them.  Drinking alcohol and taking pain medicine can cause dizziness and slow your breathing. It can even be deadly. Don't drink alcohol while taking pain medicine.  Pain medicine can make you react more slowly to things. Don't drive or run machinery while taking pain medicine.  Your healthcare provider may tell you to take acetaminophen to help ease your pain. Ask him or her how much you are supposed to take each day. Acetaminophen or other pain relievers may interact with your prescription medicines or other over-the-counter (OTC) medicines. Some prescription medicines have acetaminophen and other ingredients. Using both prescription and OTC acetaminophen for pain can cause you to overdose. Read the labels on your OTC medicines with care. This will help you to clearly know the list of ingredients, how much to take, and any warnings. It may also help you not take too much acetaminophen. If you have questions or don't understand the information, ask your pharmacist or healthcare provider to explain it to you before you take the OTC medicine.  Managing nausea  Some people have an upset stomach after surgery. This is often because of anesthesia, pain, or pain medicine, or the  stress of surgery. These tips will help you handle nausea and eat healthy foods as you get better. If you were on a special food plan before surgery, ask your healthcare provider if you should follow it while you get better. These tips may help:  Don't push yourself to eat. Your body will tell you when to eat and how much.  Start off with clear liquids and soup. They are easier to digest.  Next try semi-solid foods, such as mashed potatoes, applesauce, and gelatin, as you feel ready.  Slowly move to solid foods. Don't eat fatty, rich, or spicy foods at first.  Don't force  yourself to have 3 large meals a day. Instead eat smaller amounts more often.  Take pain medicines with a small amount of solid food, such as crackers or toast, to prevent nausea.  When to call your healthcare provider  Call your healthcare provider if:  You still have intolerable pain an hour after taking medicine. The medicine may not be strong enough.  You feel too sleepy, dizzy, or groggy. The medicine may be too strong.  You have side effects such as nausea or vomiting, or skin changes such as rash, itching, or hives. Your healthcare provider may suggest other medicines to control side effects.  Rash, itching, or hives may mean you have an allergic reaction. Report this right away. If you have trouble breathing or facial swelling, call 911 right away.  If you have obstructive sleep apnea  You were given anesthesia medicine during surgery to keep you comfortable and free of pain. After surgery, you may have more apnea spells because of this medicine and other medicines you were given. The spells may last longer than usual.   At home:  Keep using the continuous positive airway pressure (CPAP) device when you sleep. Unless your healthcare provider tells you not to, use it when you sleep, day or night. CPAP is a common device used to treat obstructive sleep apnea.  Talk with your provider before taking any pain medicine, muscle relaxants, or  sedatives. Your provider will tell you about the possible dangers of taking these medicines.  [employer] last reviewed this educational content on 10/08/2017   2000-2020 [employer], [place]. [address] [place], [place] [address]. All rights reserved. This information is not intended as a substitute for professional medical care. Always follow your healthcare professional's instructions.

## 2021-12-03 NOTE — Anesthesia Procedure Notes (Signed)
Airway Placement    Staff:  Performing Provider: Carlyon Shadow, CRNA  Authorizing Provider: Carman Ching, MD    Airway management:   Patient location: OR/Procedural area  Final airway type: Endotracheal airway  Intubation reason: General anesthesia/planned procedure    Induction:  Positioning: supine  Patient was pre-oxygenated: Yes  Preoxygenation: Yes  Mask Ventilation: Grade 1 - Ventilated by mask     Intubation:    Number of Attempts: 1    Final Attempt   Airway Type: ETT  Primary Laryngoscopy: Colin Ina Size: 2  Laryngoscopic View: Grade I  ETT Type: standard, cuffed  ETT Route: oral  Size: 6.5  Depth at: lips  (20cm)    Assessment:  Confirmation: auscultation, waveform capnography and direct visualization  Procedure Abandoned: no    Date / Time Airway Secured / Re-Secured:  12/03/2021 2:07 PM    Final procedure comments:  Atraumatic intubation with lubricated ETT, no stylet.  Note:  pre-existing chip to number 7 confirmed with Dr. Megan Salon prior to intubation.

## 2021-12-05 ENCOUNTER — Telehealth (HOSPITAL_BASED_OUTPATIENT_CLINIC_OR_DEPARTMENT_OTHER): Payer: Self-pay | Admitting: Family

## 2021-12-05 LAB — PATHOLOGY, SURGICAL

## 2021-12-05 NOTE — Telephone Encounter (Signed)
Has a few questions re: Orlissa medication. She just started her period and is wondering when to start her Edward Qualia. I advised insert states start within 7 days of onset of menses, so now is a good time to start. She is wondering if this will reduce effectiveness of Mirena IUD in terms of contraception. Warning states that Orlissa may decrease effectiveness of hormone contraception but I would think that this would not be the case with Mirena as it works in other ways as well but I would check with Dr. Debbrah Alar and she can weigh in on this when she in back in the office next week.

## 2021-12-05 NOTE — Result Encounter Note (Signed)
Benign lesion. Will discuss at post-op. No further work-up needed.

## 2021-12-08 ENCOUNTER — Telehealth (HOSPITAL_BASED_OUTPATIENT_CLINIC_OR_DEPARTMENT_OTHER): Payer: Self-pay

## 2021-12-08 NOTE — Telephone Encounter (Signed)
RETURN CALL: Voicemail - Detailed Message      SUBJECT:  Appointment Request     REASON FOR VISIT: Post Surgery Follow up Appt     PREFERRED DATE/TIME: Soonest Available.

## 2021-12-09 ENCOUNTER — Encounter (HOSPITAL_BASED_OUTPATIENT_CLINIC_OR_DEPARTMENT_OTHER): Payer: Self-pay

## 2021-12-09 ENCOUNTER — Encounter (HOSPITAL_BASED_OUTPATIENT_CLINIC_OR_DEPARTMENT_OTHER): Payer: Self-pay | Admitting: Nursing

## 2021-12-11 ENCOUNTER — Ambulatory Visit: Attending: Otolaryngology | Admitting: Otolaryngology

## 2021-12-11 ENCOUNTER — Encounter (HOSPITAL_BASED_OUTPATIENT_CLINIC_OR_DEPARTMENT_OTHER): Payer: Self-pay

## 2021-12-11 VITALS — BP 111/80 | HR 82 | Ht 66.0 in | Wt 178.0 lb

## 2021-12-11 DIAGNOSIS — K1370 Unspecified lesions of oral mucosa: Secondary | ICD-10-CM | POA: Insufficient documentation

## 2021-12-11 IMAGING — MR MRI LSPINE WO CONTRAST
5 of 6 series · 27 of 48 positions shown · IV contrast (agent unspecified)
Comparison: Lumbar spine radiograph and MRI of lumbar spine 12/05/2018.

HISTORY: 30-year-old female with chronic back pain, intervertebral disc disorders with radiculopathy, lumbosacral region, herniated nucleus pulposus, L5-S1.
TECHNIQUE: Multiplanar, multisequence MR images of the lumbar spine were obtained without intravenous contrast.

CONTRAST: None.

[Series 16: t2_sag · sagittal · 4.0mm · 0.73mm/px · 4 of 16 slices shown]
[im 1/16]
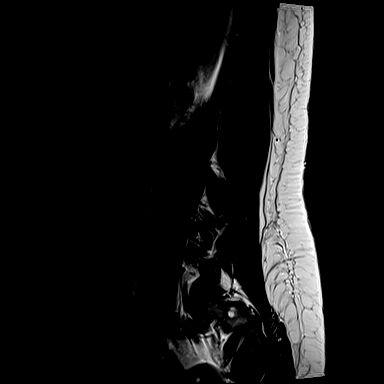
[im 6/16]
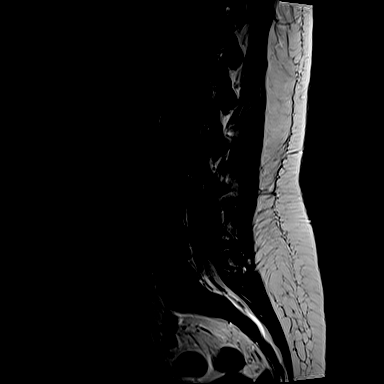
[im 11/16]
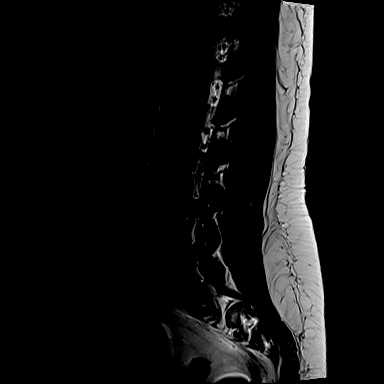
[im 16/16]
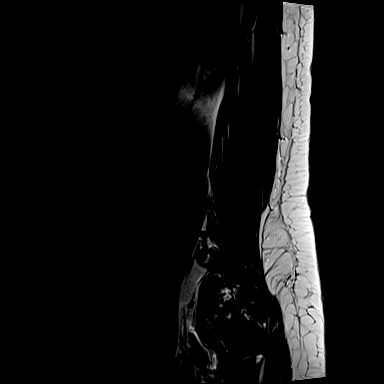

[Series 17: t1_sag · sagittal · 4.0mm · 0.88mm/px · 4 of 16 slices shown]
[im 1/16]
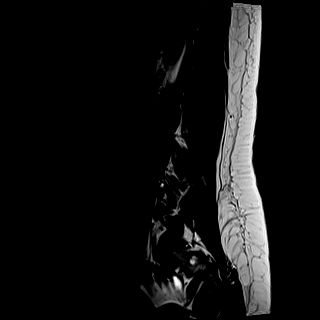
[im 6/16]
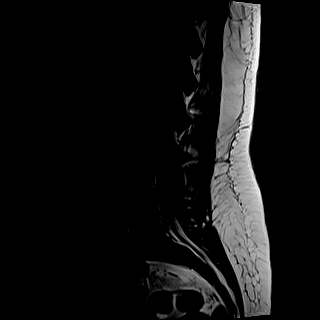
[im 11/16]
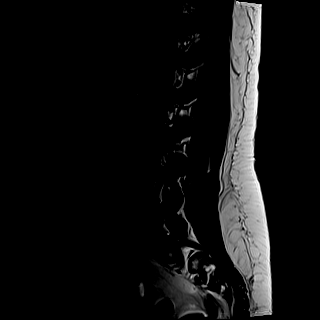
[im 16/16]
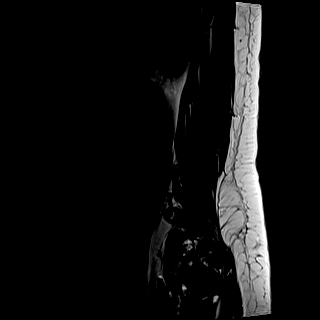

[Series 18: stir_sag · sagittal · 4.0mm · 1.09mm/px · 4 of 16 slices shown]
[im 1/16]
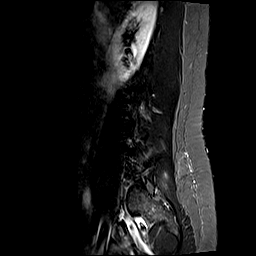
[im 6/16]
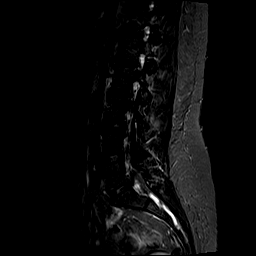
[im 11/16]
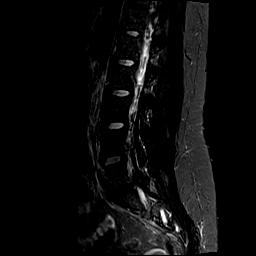
[im 16/16]
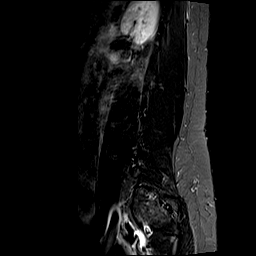

[Series 19: t2_axial · axial · 4.0mm · 0.61mm/px · z∈[-480,-302]mm · 9 of 38 slices shown]
[im 1/38]
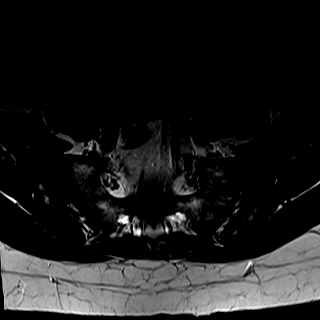
[im 5/38]
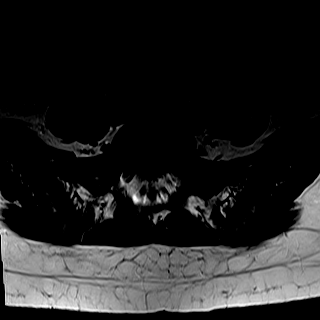
[im 10/38]
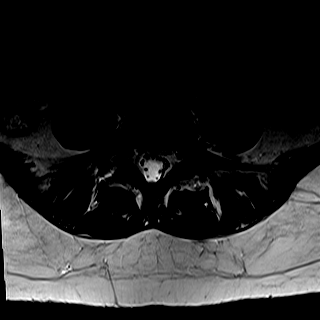
[im 14/38]
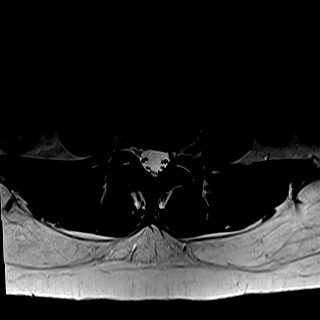
[im 19/38]
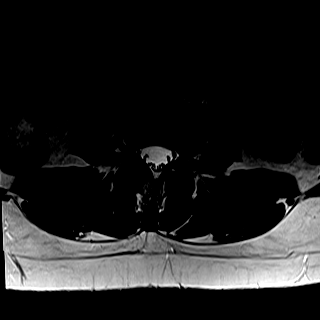
[im 24/38]
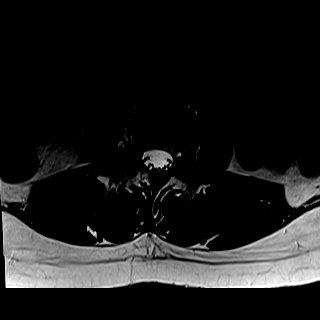
[im 28/38]
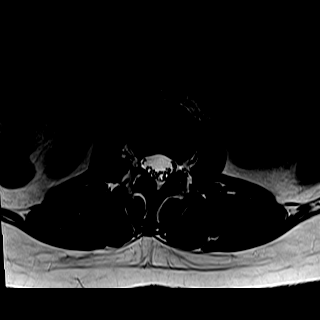
[im 33/38]
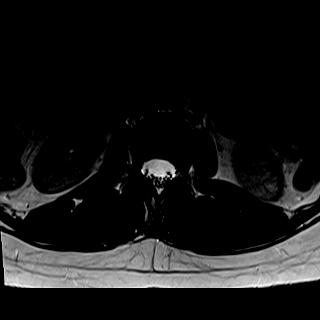
[im 38/38]
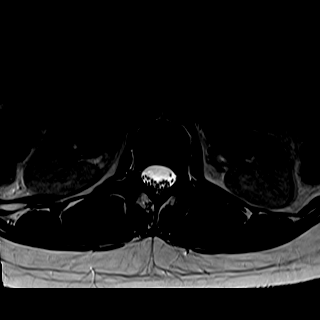

[Series 20: t1_axial_obli · axial · 3.0mm · 0.73mm/px · z∈[-500,-299]mm · 6 of 27 slices shown]
[im 1/27]
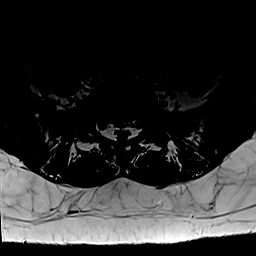
[im 6/27]
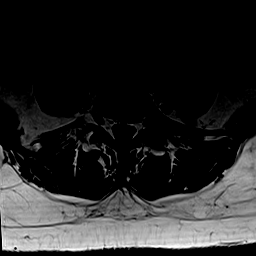
[im 11/27]
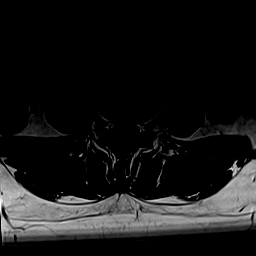
[im 16/27]
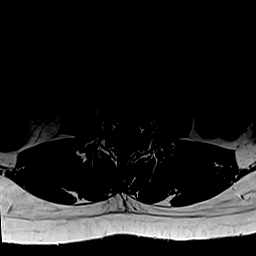
[im 21/27]
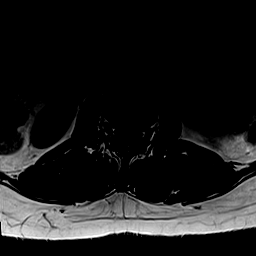
[im 27/27]
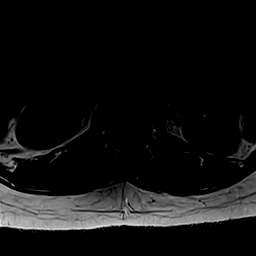

[27 of 48 positions shown; findings below may reference images not displayed]

FINDINGS: COUNT/LABELING: Please note that spinal labeling was performed assuming there are 5 non-rib-bearing lumbar type vertebrae.

ALIGNMENT: Mild leftward curvature of the lumbar spine. No subluxations.

VERTEBRAE:  Vertebral body heights are maintained. Mild degenerative endplate signal changes at L5-S1.

INTERVERTEBRAL DISCS:  Mild desiccation without height loss at L5-S1.

FACET JOINTS:  Multilevel facet arthropathy, moderate at L1-2, L2-3, L3-4 and severe at L4-5 and L5-S1.

PARASPINAL SOFT TISSUES:  No paraspinal soft tissue signal abnormalities.

DISTAL THORACIC SPINAL CORD AND CONUS MEDULLARIS:  Distal thoracic spinal cord is normal in caliber and signal with the conus medullaris terminating at L1-2, which is normal.

CAUDA EQUINA NERVE ROOTS: Unremarkable.

FINDINGS BY LEVEL:

T12-L1: No high-grade canal stenosis or foraminal narrowing.

L1-L2:  No high-grade canal stenosis or foraminal narrowing.

L2-L3:  No high-grade canal stenosis or foraminal narrowing.

L3-L4:  No high-grade canal stenosis or foraminal narrowing. 

L4-L5:  No high-grade canal stenosis or foraminal narrowing.

L5-S1:  Diffuse disc bulge without high-grade canal stenosis or foraminal narrowing.

OTHER: Moderate bilateral SI joint osteoarthritis.
IMPRESSION: 1.
Mild spondylotic changes without high-grade canal stenosis or foraminal narrowing.

2.
Severe facet arthropathy at L4-5 and L5-S1.

3.
Moderate bilateral SI joint osteoarthritis.
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
## 2021-12-17 ENCOUNTER — Ambulatory Visit
Admission: RE | Admit: 2021-12-17 | Discharge: 2021-12-17 | Disposition: A | Discharge status: Home / Self Care | Attending: Unknown Physician Specialty | Admitting: Unknown Physician Specialty

## 2021-12-17 DIAGNOSIS — N926 Irregular menstruation, unspecified: Secondary | ICD-10-CM | POA: Insufficient documentation

## 2021-12-17 DIAGNOSIS — N946 Dysmenorrhea, unspecified: Secondary | ICD-10-CM | POA: Insufficient documentation

## 2021-12-17 DIAGNOSIS — N9489 Other specified conditions associated with female genital organs and menstrual cycle: Secondary | ICD-10-CM | POA: Insufficient documentation

## 2021-12-17 DIAGNOSIS — N941 Unspecified dyspareunia: Secondary | ICD-10-CM | POA: Insufficient documentation

## 2021-12-17 DIAGNOSIS — Z975 Presence of (intrauterine) contraceptive device: Secondary | ICD-10-CM | POA: Insufficient documentation

## 2021-12-18 ENCOUNTER — Ambulatory Visit (HOSPITAL_BASED_OUTPATIENT_CLINIC_OR_DEPARTMENT_OTHER): Admitting: Physical Medicine & Rehabilitation

## 2021-12-18 ENCOUNTER — Telehealth (HOSPITAL_BASED_OUTPATIENT_CLINIC_OR_DEPARTMENT_OTHER): Payer: Self-pay | Admitting: Gynecology

## 2021-12-18 NOTE — Telephone Encounter (Signed)
Patient called today wanting to speak with provider on results of her recent Ultrasound results saying the radiologist had mentioned her IUD was "dislodged." We attempted to create a TH appointment with physician to discuss and seek next steps for possible IUD removal but based on schedule for [place], week of June 5th is too far. She had also asked if there was another option of seeing someone to  understanding the [place] and next steps before the month is over.     Please Advise.

## 2021-12-18 NOTE — Telephone Encounter (Signed)
Christina Brooks had recent US that showed that IUD is low and embedded, would like removed.  Has had difficulty with IUD removal in the past. First removal strings were short and IUD was hard to remove. Second removal had a lot of intense cramping and bleeding for almost 24 hours. She is wondering if we can provide her with something for cervical dilation and pain prior to IUD removal.     Was just put on Orlissa and is wondering option for contraception once IUD is removed since package states that hormonal contraception is not effective while on Orlissa. Is feeling better on the [place].     I advised I would check with Dr. Debbrah Alar and either call her back or send her a MyChart message.     Dr. Debbrah Alar advises she doesn't expect the IUD removal to be difficult from the [place] images. Miso can be used but has not used for removal, and only for insertion most data not conclusive if helpful. Barrier contraception would be recommended on Orilissa. She can consider having OR IUD removal and replacement if that is her interest.   If removal only, Dr. Debbrah Alar recommends in office removal if able.      I called [name] back to discuss above recommendations:     This is second IUD that has made it's way out so not interested in having another one placed. She isn't thrilled about idea of condoms but doesn't want to go off orlissa at this point.     I advised that she can take 5-10 mg of oxycodone and 600 mg ibuprofen prior to IUD removal in office and that she needs a driver. She is scheduled for in office IUD removal with me on 5/19. I discussed if she wants miso we can prescribe but it can cause nausea and cramping itself. Will forgo that. She just had surgery so actually has some oxycodone at home left over from that she will take.

## 2021-12-25 NOTE — Progress Notes (Unsigned)
12/26/21  GYN PROCEDURE, IUD REMOVAL    ID/CC: Christina Brooks is a 31 year old who presents for IUD removal after recent imaging shows malpositioned IUD    Developed a bump on labia. Noted a few days ago, hasn't changed. Is uncomfortable.     Started orlissa about 2-3 weeks ago. Already feeling better. Not having weird bleeding or camping.     Working with pelvic floor PT.    Currently does not want future fertility but not wanting to close the option at this point.     Pt took ibuprofen ahead of today's visit.       Objective:  Vitals:    12/26/21 0941   BP: 121/80   Pulse: 81   Temp: 36.8 C   SpO2: 98%     There is no height or weight on file to calculate BMI.    Written consent for IUD removal obtained.     Gen: well appearing, NAD  Pelvic: external genitalia examined on right labia where pt has felt bump. No current bumps seen or palpated. Pt tried to find as well and unable to locate today. Speculum exam: cervix anterior, IUD strings visible, grasped with ring forceps, and IUD removed in entirety. Pt tolerated procedure well without immediate complication.     CMA was chaperone for exam and procedure.       Assessment/Plan: 31 year old who presents for IUD removal after recent imaging shows malpositioned IUD.     #. IUD successfully removed today.   - given she is on Orlissa, use condoms for birth control.     #. Bump labia  - not appreciated today, if returns, return for eval    #. Pollie Friar use  - pt wondering how long should be taking this. I advised that she should make an appointment with Dr. Quay Burow to review plan moving forward. Still considering laparoscopy for eval of endometriosis.     Ewell Poe, ARNP/FNP-C  Gynecology and Gynecologic Oncology at [facility]  [address]  [place], [place] [address]  Ph: [phone]  Fax: [phone]

## 2021-12-26 ENCOUNTER — Encounter (HOSPITAL_BASED_OUTPATIENT_CLINIC_OR_DEPARTMENT_OTHER): Payer: Self-pay | Admitting: Family

## 2021-12-26 ENCOUNTER — Ambulatory Visit: Attending: Family | Admitting: Family

## 2021-12-26 VITALS — BP 121/80 | HR 81 | Temp 98.2°F

## 2021-12-26 DIAGNOSIS — T8332XA Displacement of intrauterine contraceptive device, initial encounter: Secondary | ICD-10-CM | POA: Insufficient documentation

## 2022-01-27 ENCOUNTER — Encounter (HOSPITAL_BASED_OUTPATIENT_CLINIC_OR_DEPARTMENT_OTHER): Payer: Self-pay | Admitting: Gynecology

## 2022-01-27 ENCOUNTER — Ambulatory Visit: Attending: Gynecology | Admitting: Gynecology

## 2022-01-27 DIAGNOSIS — N946 Dysmenorrhea, unspecified: Secondary | ICD-10-CM | POA: Insufficient documentation

## 2022-01-27 DIAGNOSIS — N809 Endometriosis, unspecified: Secondary | ICD-10-CM | POA: Insufficient documentation

## 2022-01-27 NOTE — Progress Notes (Signed)
Distant Site Telemedicine Encounter  I conducted this encounter via secure, live, face-to-face video conference with the patient. I reviewed the risks and benefits of telemedicine as pertinent to this visit and the patient agreed to proceed.    Provider Location: On-site location (clinic, hospital, on-site office)  Patient Location: At home  Present with patient: No one else present     Christina Brooks presents with her husband for follow up. She has been on Orilissa for 6 weeks or so with substantial improvement in her pelvic discomfort. Her malpositioned Mirena IUD was removed on 5/19 and she has not had any bleeding.  Working with pelvic floor PT as well.  Of note re IUDs she recalls active [place] use with first placement and that after the recently malpositioned one was placed she also had [place] confirming normal positioning.    (N94.6) Dysmenorrhea  (primary encounter diagnosis)    (N80.9) Endometriosis  Plan: Clinical diagnosis of endometriosis is confirmed with symptom response to [facility] therapy. Plan to continue x 1 year. Barrier contraception reviewed- with amenorrhea unlikely to conceive but these agents have not been studied as contraceptive methods alone.    Dietary / stress/ other factors in endometriosis- inflammation/ immune response noted.    RTC for annual in spring to discuss next steps/ prn concerns or questions.  25 minutes total time
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
## 2022-02-04 ENCOUNTER — Ambulatory Visit: Attending: Physical Medicine & Rehabilitation | Admitting: Physical Medicine & Rehabilitation

## 2022-02-04 VITALS — BP 108/76 | HR 85 | Temp 98.0°F | Ht 66.0 in | Wt 178.0 lb

## 2022-02-04 DIAGNOSIS — M5459 Other low back pain: Secondary | ICD-10-CM | POA: Insufficient documentation

## 2022-02-04 DIAGNOSIS — G894 Chronic pain syndrome: Secondary | ICD-10-CM | POA: Insufficient documentation

## 2022-02-04 DIAGNOSIS — G8929 Other chronic pain: Secondary | ICD-10-CM | POA: Insufficient documentation

## 2022-02-04 DIAGNOSIS — M545 Low back pain, unspecified: Secondary | ICD-10-CM | POA: Insufficient documentation

## 2022-02-04 NOTE — Progress Notes (Signed)
I saw and evaluated the patient with this fellow Dr. Malia Cali and was present for the key portions of the medical encounter. I repeated the essential components of the history and exam. I discussed the case with the fellow, and have reviewed the note. I agree with the findings and plan as documented in the fellow's note. The progress note was edited as necessary.    Tarrie Mcmichen, MD  Clinical Associate Professor  Sports & Spine Medicine  Department of Rehabilitation Medicine

## 2022-02-11 ENCOUNTER — Ambulatory Visit (HOSPITAL_BASED_OUTPATIENT_CLINIC_OR_DEPARTMENT_OTHER): Admitting: Physical Medicine & Rehabilitation

## 2022-03-04 ENCOUNTER — Encounter (HOSPITAL_BASED_OUTPATIENT_CLINIC_OR_DEPARTMENT_OTHER): Payer: Self-pay | Admitting: Anesthesiology

## 2022-03-04 NOTE — Progress Notes (Signed)
NEW PATIENT CLINIC INTAKE INTERVIEW    Name of Referring Provider: Pauletta Browns, MD  Provider's clinic/location: [facility]  Diagnosis: Lumbar facet joint pain, Chronic pain syndrome    Referring providers notes YES  Imaging: YES: imaging was done within [place]. Reports and images are in PACs  CT: NO  MRI: YES  X-Ray: NO  Ultrasound: NO  NM Bone Scan 02/14/2021  In PACs: YES  Physical Therapy Has done PT in the past  Other Pain clinic notes NO  Pain Tracker done: No pain tracker was not completed, reminder to be sent via MyChart  Surgeries  or Procedures pertaining YES: Name of Facility/Hospital: In [place], Reason for Surgery: Facet Injection, and Approximate month/year: unknown    Name of Facility/Hospital: [facility], Reason for Surgery: SIJ Injection, and Approximate month/year: Unknown    Name of Facility/Hospital: Unknown location, Reason for Surgery: Left hip labral repair , and Approximate month/year: 2020      EMG/NCS Performed in April 2022

## 2022-03-10 ENCOUNTER — Ambulatory Visit: Attending: Anesthesiology | Admitting: Anesthesiology

## 2022-03-10 ENCOUNTER — Encounter (HOSPITAL_BASED_OUTPATIENT_CLINIC_OR_DEPARTMENT_OTHER): Payer: Self-pay | Admitting: Anesthesiology

## 2022-03-10 VITALS — BP 110/78 | HR 84 | Temp 97.4°F | Ht 66.0 in | Wt 178.0 lb

## 2022-03-10 DIAGNOSIS — M5459 Other low back pain: Secondary | ICD-10-CM | POA: Insufficient documentation

## 2022-03-10 DIAGNOSIS — G894 Chronic pain syndrome: Secondary | ICD-10-CM | POA: Insufficient documentation

## 2022-03-10 DIAGNOSIS — M7918 Myalgia, other site: Secondary | ICD-10-CM | POA: Insufficient documentation
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
## 2022-03-10 NOTE — Progress Notes (Signed)
Review of Systems   HENT:  Positive for tinnitus.    Cardiovascular:  Positive for chest pain.   Musculoskeletal:  Positive for arthralgias, back pain, gait problem, myalgias, neck pain and neck stiffness.   Psychiatric/Behavioral:  Positive for decreased concentration, dysphoric mood and sleep disturbance. The patient is nervous/anxious.    All other systems reviewed and are negative.    Additional Notes:  PTSD

## 2022-03-10 NOTE — Patient Instructions (Signed)
Recommendations:  Diagnostic evaluation to discuss with her PCP, Myrene Buddy, PA-C and/or other involved providers: None    Rehabilitation: PT focused on flexibility and core strength, with swimming    Medication options to discuss with her PCP, Myrene Buddy, PA-C and/or other involved providers: Continue diclofenac therapy PRN    4.  Interventions: Bilateral upper back and lower back trigger point injections with ultrasound guidance
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
## 2022-03-19 ENCOUNTER — Encounter (HOSPITAL_BASED_OUTPATIENT_CLINIC_OR_DEPARTMENT_OTHER): Payer: Self-pay

## 2022-04-06 ENCOUNTER — Other Ambulatory Visit (HOSPITAL_BASED_OUTPATIENT_CLINIC_OR_DEPARTMENT_OTHER): Payer: Self-pay | Admitting: Gynecology

## 2022-04-06 DIAGNOSIS — N941 Unspecified dyspareunia: Secondary | ICD-10-CM

## 2022-04-06 DIAGNOSIS — N9489 Other specified conditions associated with female genital organs and menstrual cycle: Secondary | ICD-10-CM

## 2022-04-06 DIAGNOSIS — N946 Dysmenorrhea, unspecified: Secondary | ICD-10-CM

## 2022-04-06 MED ORDER — ELAGOLIX SODIUM 200 MG OR TABS
200.0000 mg | ORAL_TABLET | Freq: Two times a day (BID) | ORAL | 11 refills | Status: DC
Start: 2022-04-06 — End: 2022-04-23

## 2022-04-07 ENCOUNTER — Telehealth (HOSPITAL_BASED_OUTPATIENT_CLINIC_OR_DEPARTMENT_OTHER): Payer: Self-pay | Admitting: Nursing

## 2022-04-07 ENCOUNTER — Ambulatory Visit: Attending: Anesthesiology | Admitting: Anesthesiology

## 2022-04-07 DIAGNOSIS — M7918 Myalgia, other site: Secondary | ICD-10-CM | POA: Insufficient documentation

## 2022-04-07 NOTE — Telephone Encounter (Addendum)
04/06/22: Message from front desk. Patient requesting Dewayne Hatch rx sent to [facility] on [address] in [place]. Dr. Quay Burow submitted change. Patient provided phone number [phone] to contact. RN spoke with representative, will need new prior auth. Submitted via [employer] provider portal.  Key: [id number], pending authorization    04/07/22: Prior auth still pending with [facility]    04/08/22: Dewayne Hatch denied. Will need negative hcg test. RN spoke with with Lisette Abu at [facility] [employer]. After authorized, patient will be allowed one fill at retail pharmacy then will need to fill through [facility] home delivery service. $68 for 3 month supply. Maebelle Munroe does not see any retail fills on file for patient so ok to get first fill at patient preferred local pharmacy and then switch to [facility].  Call transferred to prior auth department, spoke with [facility].    TC to [facility] [employer]. She will go to [facility] for hcg test. Once result available, will submit to [employer]/[facility]. Per rep, should be approved.      Addendum 1500: Negative hcg test now documented in Epic. New prior auth request submitted to [facility]. Key: [id number], case ID: [id number]

## 2022-04-07 NOTE — Progress Notes (Signed)
Ms. Raymond identified by name and DOB.   She confirmed she is having a [place] Bilateral Upper Back and Lower Back TPI  She ambulated to procedure area with steady gait.  She denies any possibility of being pregnant or currently/planning to breastfeed.   She has not fallen in the last 6 months.  Fall prevention interventions: Patient provided with non-skid stockings  She denies taking any blood thinners.  She denies having a bleeding disorder.  She  Has held her medications per provider instructions.  She has not been on antibiotics for the past two weeks.   She has a history of fainting during medical procedures, faints with blood draws.   She has not been NPO.  She undressed self without assistance.  Is She receiving a steroid injection today? no  If yes, has She received the COVID-19 vaccine (either brand) within the past 2 weeks? N/a  OR does She plan to receive the COVID-19 vaccine (either brand) within the next 2 weeks?n/a  I verbally reviewed written discharge instructions and pain diary with her.   Signed consent for series not older than 90 days is scanned in the patient's chart: yes, new consent on chart  Confirmed ride home with her spouse Legrand Como [phone]     She has a pre-procedure pain score of 4/10 in upper and lower back    Pain score prior to procedure:  4/10  Pain score after procedure:  5/10    Ms. Goonan met discharge criteria:  A/OX4/baseline, VSS/returned to baseline, ambulatory with steady gait. Site clean dry and intact.  Written and verbal discharge instructions, including emergency contact phone numbers, reviewed with Ms. Osinski. Verbal understanding obtained from her.  Ms. Kolasinski dressed self without assistance. She was discharged in stable condition, ambulatory with steady gait to home with all belongings and with ride/responsible person.

## 2022-04-07 NOTE — Patient Instructions (Signed)
[facility]  Post-Procedure Discharge Instructions      After the procedure:  Immediate and complete pain relief is rare  Numbness and/or weakness in the area of your body supplied by the injected nerve; these symptoms should resolve but may last up to several hours  Some soreness and bruising at the injection site(s)    Activities:  If you have any weakness or numbness caused by the injection, DO NOT DRIVE or operate machinery and limit other activity until sensation returns to normal.  You may resume regular exercises/activities as tolerated.  If you received sedation, DO NOT DRIVE or operate machinery for 24 hours.    Medications:  If you stopped taking any blood thinning medications such as Coumadin or Plavix, you may resume these tomorrow unless specified differently by the prescribing physician.    Site care:  You may remove the band-aid after 6 hours.  You may shower today. No swimming, tub baths or hot tubs for 24 hours following your procedure.  For the first 48 hours, apply ice packs to the injection site for 15-20 minutes hourly as needed for comfort.  Wrap a light towel or cloth around ice packs and heating pad to protect the skin.  After 48 hours, use a warm heating pad to the injection site for 15-20 minutes hourly as needed for comfort.    If you received steroids today:  Steroid medications may cause facial flushing, occasional low-grade fevers, hiccups, insomnia, headaches, water retention, increased appetite, increased heart rate, and abdominal cramping or bloating. These side effects occur in only about 5 percent of patients and commonly disappear within one to three days after the injection.  If you are diabetic check your blood sugar more frequently than usual as you may develop an increase in blood sugar for the next 10-14 days. Contact your diabetes physician if this occurs.    Call us if you develop any of the following symptoms in the next 7 days:  Fever above 100 degrees  F     Any unusual increase in your level of pain  Swelling, bleeding, redness, or increased tenderness at the procedure or IV site  Headache not relieved by Tylenol (if you had an epidural steroid injection)  .    Contact us:  Anytime, 24 hours/day, 7 days/week at [facility]  Patient Self-Administered Pain Diary  1 week        The procedure you just had was done in hopes of providing long term pain relief. Depending on the type of procedure you had, you will need to answer the questions below 1 week after your procedure.  Accurate completion and timely reporting of your pain diary(s) enables your Pain MD to review the results and make further treatment recommendations.    1 week after your procedure, write your answers to the 4 questions below and send your pain score diary to [place] either by:      1)  Sending a picture via [employer]    2)  Faxing to [phone]  3)  Calling the pain score voicemail line at [phone].  Leave the spelling of your first and last name, U#, date of birth and date of procedure and the answers to all of the 4 questions below.             Use this scale to rate your pain  0 1 2 3 4 5 6 7 8 9  10  No pain                      worst pain        Procedure Date: 04/07/22    1.  Pain score immediately before procedure: __________    2. 1 week after your procedure, do you feel better?  YES/NO    3.  If you answered YES to the above, by what percentage__________% (0-100%) has your pain been relieved.    4.  Pain Score now: _________ (at time of telephone call)

## 2022-04-07 NOTE — Progress Notes (Signed)
TRIGGER POINT INJECTION WITH ULTRASOUND GUIDANCE    PROCEDURE  Trigger point injection of the multifidus, longissimus, trapezius, erector spinae ; side: bilateral.    PRE-PROCEDURE DIAGNOSIS  (M79.18) Myofascial pain.    POST-PROCEDURE DIAGNOSIS  (M79.18) Myofascial pain.    INDICATION FOR INJECTION  Chronic upper and lower back pain with suspected myofascial components.    CONSENT:    Potential risks including pain, serious infection, seizure, vessel or nerve injury, allergic reaction, hypertensive crisis, vasovagal reaction and others were discussed with the patient. The patient was given and read a patient information sheet regarding injection procedures prior to today's injection. The patient understands the benefits, risks, and alternatives of the procedure and agrees to the procedure today.  Written informed consent was obtained.     ATTENDING PHYSICIAN  [place] Service, MD    FELLOW / RESIDENT  None    SEDATION  None.    NPO STATUS  None.    INTRAVENOUS LINE  None.    DESCRIPTION OF THE PROCEDURE  Position: Prone.  Imaging technique: ltrasound,  15-6 MHz transducer.  Time-out: Performed.  Needle used: 25G, 2 inch.  Needle insertion: In-plane.  Imaging conditions: Good visualization of the muscle planes.  After disinfection of the skin with chlorhexidine of the general area and sterile draping, the needle was inserted under ultrasound guidance.  injection of a total of 12 ml of bupivacaine 0.25% at 14 trigger points.    The injections were performed at the following sites / muscle groups: multifidus, longissimus, trapezius, erector spinae    Uneventful removal of the needle.    POST-PROCEDURE / INTERPRETATION  Uneventful. Typically, the effect of trigger point injections cannot be evaluated immediately after procedure.    COMPLICATIONS  None.    PLAN  She will follow up in clinic after the procedure.

## 2022-04-08 ENCOUNTER — Other Ambulatory Visit (HOSPITAL_BASED_OUTPATIENT_CLINIC_OR_DEPARTMENT_OTHER): Payer: Self-pay | Admitting: Gynecology

## 2022-04-08 ENCOUNTER — Other Ambulatory Visit
Admission: RE | Admit: 2022-04-08 | Discharge: 2022-04-08 | Disposition: A | Discharge status: Home / Self Care | Source: Ambulatory Visit | Attending: Gynecology | Admitting: Gynecology

## 2022-04-08 DIAGNOSIS — N912 Amenorrhea, unspecified: Secondary | ICD-10-CM

## 2022-04-08 LAB — PREGNANCY TEST, URINE
Pregnancy Test, URN: NEGATIVE
Specific Gravity, URN: 1.015 g/mL (ref 1.006–1.027)

## 2022-04-10 ENCOUNTER — Telehealth (HOSPITAL_BASED_OUTPATIENT_CLINIC_OR_DEPARTMENT_OTHER): Payer: Self-pay | Admitting: Nursing

## 2022-04-10 DIAGNOSIS — N809 Endometriosis, unspecified: Secondary | ICD-10-CM

## 2022-04-10 NOTE — Telephone Encounter (Signed)
TC to [place] to let her know that Orilissa 200 mg BID prescription is approved. She should be able to fill today at [facility]. Reiterated that long-term Orillisa use could lead to bone density loss so calcium and vitamin D supplement is recommended. She takes a multivitamin which should contain some calcium and vitamin D. Does not intend to stay on medication long-term. Case ID [id number]    Christina Brooks is looking into filling rx [facility] or through [facility] to be able to get 90 day fill. Aware that there is a Tour manager fill penalty from her insurance if she fills at [facility], [facility] etc more than twice. She will reach out with which pharmacy to transfer rx.

## 2022-04-23 ENCOUNTER — Other Ambulatory Visit (HOSPITAL_BASED_OUTPATIENT_CLINIC_OR_DEPARTMENT_OTHER): Payer: Self-pay | Admitting: Family

## 2022-04-23 DIAGNOSIS — N9489 Other specified conditions associated with female genital organs and menstrual cycle: Secondary | ICD-10-CM

## 2022-04-23 DIAGNOSIS — N946 Dysmenorrhea, unspecified: Secondary | ICD-10-CM

## 2022-04-23 DIAGNOSIS — N941 Unspecified dyspareunia: Secondary | ICD-10-CM

## 2022-04-23 MED ORDER — ELAGOLIX SODIUM 200 MG OR TABS
200.0000 mg | ORAL_TABLET | Freq: Two times a day (BID) | ORAL | 11 refills | Status: DC
Start: 2022-04-23 — End: 2022-04-28

## 2022-04-28 ENCOUNTER — Telehealth (HOSPITAL_BASED_OUTPATIENT_CLINIC_OR_DEPARTMENT_OTHER): Payer: Self-pay | Admitting: Nursing

## 2022-04-28 MED ORDER — ELAGOLIX SODIUM 200 MG OR TABS
200.0000 mg | ORAL_TABLET | Freq: Two times a day (BID) | ORAL | 2 refills | Status: DC
Start: 2022-04-28 — End: 2023-04-01

## 2022-04-28 NOTE — Addendum Note (Signed)
Addended by: Jacquelyne Balint ANN on: 04/28/2022 10:08 AM     Modules accepted: Orders

## 2022-04-28 NOTE — Telephone Encounter (Signed)
TC from [place]. Needs new Freida Busman rx sent to [facility] and verbal ok for 3 month home delivery. [facility] plans to send out one month of medication today but Terrah will be travelling Dr. Debbrah Alar sent new rx. RN called [facility] to get rx changed so 3 months sent today. Spoke with Wyatt Portela, then call transferred to patient care coordinator [place]. Unfortunately, line disconnected. RN called [facility] back, spoke with [place]. Call transferred to [facility]. Confirms that no medication is being shipped to patient and new rx in process. Marta Antu advised that patient check her online portal for status of new rx.  TC back to [place]. Provided update and encouraged her to check patient portal for rx updates but anticipate new rx will be approved in a few days by [facility] and shipped to her home.

## 2022-05-07 ENCOUNTER — Ambulatory Visit: Admitting: Anesthesiology

## 2022-05-07 DIAGNOSIS — M5459 Other low back pain: Secondary | ICD-10-CM

## 2022-05-07 DIAGNOSIS — M7918 Myalgia, other site: Secondary | ICD-10-CM

## 2022-05-07 NOTE — Progress Notes (Signed)
Christina Brooks;  MRN: [id number];   DOB: [birth year]    Distant Site Telemedicine Encounter  I conducted this encounter via secure, live, face-to-face video conference with the patient. I reviewed the risks and benefits of telemedicine as pertinent to this visit and the patient agreed to proceed.    Provider Location: On-site location (clinic, hospital, on-site office)  Patient Location: At home  Present with patient: No one else present         CHIEF COMPLAINT:   Myofascial Pain    HISTORY OF PRESENT ILLNESS:  Christina Brooks is a 31 year old female with a history of PTSD and low back pain.   She has pain located primarily in the Mid Back , Low Back, and Hips,  Left-sided.  She has been diagnosed with herniated disc, and left hip labrum tear.       The patient presents to today for interval reevaluation of myofascial pain in the middle and upper back. The patient has a pain history significant for myofascial back pain. The patient was last seen by Rosette Reveal on 8/29 at which time she received TPI.    Today the patient's pain is rated as 3/10 in severity on the verbal numeric rating scale. Significant reduction in pain, increased function, increased ability to tolerate exercise and flights, and overall improved pain.    The pain is located back.    She describes her pain as aching. She feels the pain is improved.    Other than that as stated above the patient denies any changes in past medical history, medications, or allergies since the last office visit.    REVIEW OF SYSTEMS:  The review of systems documented by our clinic staff was provided by the patient. I reviewed and confirmed these details with Jerene Pitch and have no further detail to add.    PHYSICAL EXAMINATION:  There were no vitals taken for this visit.   Constitutional: She is Constitutional: well-appearing and exhibits pain behavior  Skin, exposed parts: color normal  Eyes: Negative for miosis bilaterally. Pupils appropriate for room lighting.  Extraocular movements areintact bilaterally  Gastrointestinal : Abdomen is soft, non-tender.  Neurologic: normal  Musculoskeletal: normal  Psychiatric: Judgement/insight are normal.     IMPRESSION:   Christina Brooks is a 31 year old female with myofascial pain, s/p trigger point injections which have worked very well for her.  I explained that these can be repeated on a monthly basis.  She is in agreement.    PLAN:    My impressions and treatment recommendations were discussed in detail with the patient who had no further questions before leaving the office today.     Trigger point injections of the bilateral upper back.    Rosette Reveal, MD

## 2022-05-15 ENCOUNTER — Encounter (HOSPITAL_BASED_OUTPATIENT_CLINIC_OR_DEPARTMENT_OTHER): Payer: Self-pay

## 2022-07-10 ENCOUNTER — Encounter (HOSPITAL_BASED_OUTPATIENT_CLINIC_OR_DEPARTMENT_OTHER): Payer: Self-pay

## 2022-07-23 ENCOUNTER — Ambulatory Visit: Attending: Anesthesiology | Admitting: Anesthesiology

## 2022-07-23 DIAGNOSIS — M7918 Myalgia, other site: Secondary | ICD-10-CM

## 2022-07-23 NOTE — Progress Notes (Signed)
Christina Brooks identified by name and DOB.   She confirmed she is having a Bilateral neck and upper back TPI 1/6  She ambulated to procedure area with steady gait.  She denies any possibility of being pregnant or currently/planning to breastfeed.   She has not fallen in the last 6 months.  Fall prevention interventions: Patient provided with non-skid stockings  She denies taking any blood thinners.  She denies having a bleeding disorder.  She  Has held her medications per provider instructions.  She has not been on antibiotics for the past two weeks.   She denies a history of fainting during medical procedures.   She has not been NPO.  She undressed self without assistance.  Is She receiving a steroid injection today? No  I verbally reviewed written discharge instructions and pain diary with her.   New consent on chart  No escort per order  She has a pre-procedure pain score of 4/10, (9-10 with flare ups that happen monthly)

## 2022-07-23 NOTE — Progress Notes (Signed)
TRIGGER POINT INJECTION WITH ULTRASOUND GUIDANCE     PROCEDURE  Trigger point injection of the multifidus, longissimus, trapezius, erector spinae ; side: bilateral.     PRE-PROCEDURE DIAGNOSIS  (M79.18) Myofascial pain.     POST-PROCEDURE DIAGNOSIS  (M79.18) Myofascial pain.     INDICATION FOR INJECTION  Chronic upper and lower back pain with suspected myofascial components.     CONSENT:    Potential risks including pain, serious infection, seizure, vessel or nerve injury, allergic reaction, hypertensive crisis, vasovagal reaction and others were discussed with the patient. The patient was given and read a patient information sheet regarding injection procedures prior to today's injection. The patient understands the benefits, risks, and alternatives of the procedure and agrees to the procedure today.  Written informed consent was obtained.      ATTENDING PHYSICIAN  Moyinoluwa Dawe, MD     FELLOW / RESIDENT  None     SEDATION  None.     NPO STATUS  None.     INTRAVENOUS LINE  None.     DESCRIPTION OF THE PROCEDURE  Position: Prone.  Imaging technique: ltrasound,  15-6 MHz transducer.  Time-out: Performed.  Needle used: 25G, 2 inch.  Needle insertion: In-plane.  Imaging conditions: Good visualization of the muscle planes.  After disinfection of the skin with chlorhexidine of the general area and sterile draping, the needle was inserted under ultrasound guidance.  injection of a total of 10 ml of bupivacaine 0.25% at 10 trigger points.     The injections were performed at the following sites / muscle groups: multifidus, longissimus, trapezius, erector spinae     Uneventful removal of the needle.     POST-PROCEDURE / INTERPRETATION  Uneventful. Typically, the effect of trigger point injections cannot be evaluated immediately after procedure.     COMPLICATIONS  None.     PLAN  She will return for repeat TPIs.

## 2022-07-23 NOTE — Progress Notes (Signed)
Pain score prior to procedure:  4/10  Pain score after procedure:  4-5/10    Ms. Hessel met discharge criteria:  A/OX4/baseline, VSS/returned to baseline, ambulatory with steady gait. Site clean dry and intact.  Written and verbal discharge instructions, including emergency contact phone numbers, reviewed with Ms. Sundt. Verbal understanding obtained from her.  Ms. Seckman dressed self without assistance. She was discharged in stable condition, ambulatory with steady gait to home with all belongings and with ride/responsible person.

## 2022-07-23 NOTE — Patient Instructions (Signed)
[facility]  Post-Procedure Discharge Instructions      After the procedure:  • Immediate and complete pain relief is rare  • Numbness and/or weakness in the area of your body supplied by the injected nerve; these symptoms should resolve but may last up to several hours  • Some soreness and bruising at the injection site(s)    Activities:  • If you have any weakness or numbness caused by the injection, DO NOT DRIVE or operate machinery and limit other activity until sensation returns to normal.  • You may resume regular exercises/activities as tolerated.  • If you received sedation, DO NOT DRIVE or operate machinery for 24 hours.    Medications:  If you stopped taking any blood thinning medications such as Coumadin or Plavix, you may resume these tomorrow unless specified differently by the prescribing physician.    Site care:  • You may remove the band-aid after 6 hours.  • You may shower today. No swimming, tub baths or hot tubs for 24 hours following your procedure.  • For the first 48 hours, apply ice packs to the injection site for 15-20 minutes hourly as needed for comfort.  Wrap a light towel or cloth around ice packs and heating pad to protect the skin.  • After 48 hours, use a warm heating pad to the injection site for 15-20 minutes hourly as needed for comfort.    If you received steroids today:  • Steroid medications may cause facial flushing, occasional low-grade fevers, hiccups, insomnia, headaches, water retention, increased appetite, increased heart rate, and abdominal cramping or bloating. These side effects occur in only about 5 percent of patients and commonly disappear within one to three days after the injection.  • If you are diabetic check your blood sugar more frequently than usual as you may develop an increase in blood sugar for the next 10-14 days. Contact your diabetes physician if this occurs.    Call us if you develop any of the following symptoms in the next 7  days:  • Fever above 100 degrees F     • Any unusual increase in your level of pain  • Swelling, bleeding, redness, or increased tenderness at the procedure or IV site  • Headache not relieved by Tylenol (if you had an epidural steroid injection)  .    Contact us:  Anytime, 24 hours/day, 7 days/week at [phone]    [facility]  Patient Self-Administered 6 Hour Pain Diary    1.  For accuracy keep this diary with you during the 6 hour period so you can write down your pain scores as you go, at the time noted on your diary.  During the 6 hours, please document your pain scores while doing activities that provoke your pain as well as when you are not doing those activities.      2.  Within 24 hours of completing this pain score diary, please send us your pain score diary either by:  1) Scanning into [facility]  2)  Faxing to [phone] or 3)  Calling the pain score voicemail line at [phone].  Leave the spelling of your first and last name,  U#, date of birth, date of service and the 20 numbers you wrote below, including pre-procedure, immediately post procedure and % pain relief.  After that, you may leave any comments regarding your post block experience that may be helpful (e.g. was able to walk, much worse, had to take   medications).      Use this scale to rate your pain  0 1 2 3 4 5 6 7 8 9 10  No pain                                          worst pain     TIME PAIN SCORE WITH PAINFUL ACTIVITY PAIN SCORE WITHOUT PAINFUL ACTIVITY   Before injections:       Immediately after injections:       30 minutes after injections:       1 hour after injections:       2 hours after injections:       3 hours after injections:       4 hours after injections:       5 hours after injections:       6 hours after injections:       When I had the lowest pain score, my % (percentage) pain relief:          IT IS ESSENTIAL YOU CALL IN YOUR PAIN DIARY RESULTS BEFORE ANY FURTHER PROCEDURE(S) CAN BE SCHEDULED.

## 2022-07-28 ENCOUNTER — Encounter (HOSPITAL_BASED_OUTPATIENT_CLINIC_OR_DEPARTMENT_OTHER): Payer: Self-pay
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
## 2022-09-01 ENCOUNTER — Ambulatory Visit (HOSPITAL_BASED_OUTPATIENT_CLINIC_OR_DEPARTMENT_OTHER): Admitting: Anesthesiology

## 2022-09-01 ENCOUNTER — Telehealth (HOSPITAL_BASED_OUTPATIENT_CLINIC_OR_DEPARTMENT_OTHER): Payer: Self-pay | Admitting: Anesthesiology

## 2022-09-01 ENCOUNTER — Ambulatory Visit: Attending: Anesthesiology | Admitting: Anesthesiology

## 2022-09-01 VITALS — BP 113/78 | HR 70 | Temp 98.2°F | Resp 16

## 2022-09-01 DIAGNOSIS — M62838 Other muscle spasm: Secondary | ICD-10-CM

## 2022-09-01 DIAGNOSIS — M542 Cervicalgia: Secondary | ICD-10-CM

## 2022-09-01 DIAGNOSIS — M7630 Iliotibial band syndrome, unspecified leg: Secondary | ICD-10-CM | POA: Insufficient documentation

## 2022-09-01 DIAGNOSIS — M7918 Myalgia, other site: Secondary | ICD-10-CM | POA: Insufficient documentation

## 2022-09-01 DIAGNOSIS — G894 Chronic pain syndrome: Secondary | ICD-10-CM

## 2022-09-01 MED ORDER — PREGABALIN 25 MG OR CAPS
25.0000 mg | ORAL_CAPSULE | Freq: Two times a day (BID) | ORAL | 0 refills | Status: DC
Start: 2022-09-01 — End: 2022-12-24

## 2022-09-01 MED ORDER — CYCLOBENZAPRINE HCL 5 MG OR TABS
5.0000 mg | ORAL_TABLET | Freq: Three times a day (TID) | ORAL | 1 refills | Status: DC | PRN
Start: 2022-09-01 — End: 2023-03-25

## 2022-09-01 NOTE — Progress Notes (Signed)
TRIGGER POINT INJECTION WITH ULTRASOUND GUIDANCE     PROCEDURE  Trigger point injection of the multifidus, longissimus, trapezius, erector spinae ; side: bilateral.     PRE-PROCEDURE DIAGNOSIS  (M79.18) Myofascial pain.     POST-PROCEDURE DIAGNOSIS  (M79.18) Myofascial pain.     INDICATION FOR INJECTION  Chronic upper and lower back pain with suspected myofascial components.     CONSENT:    Potential risks including pain, serious infection, seizure, vessel or nerve injury, allergic reaction, hypertensive crisis, vasovagal reaction and others were discussed with the patient. The patient was given and read a patient information sheet regarding injection procedures prior to today's injection. The patient understands the benefits, risks, and alternatives of the procedure and agrees to the procedure today.  Written informed consent was obtained.      ATTENDING PHYSICIAN  Rosette Reveal, MD     FELLOW / RESIDENT  None     SEDATION  None.     NPO STATUS  None.     INTRAVENOUS LINE  None.     DESCRIPTION OF THE PROCEDURE  Position: Prone.  Imaging technique: ltrasound,  15-6 MHz transducer.  Time-out: Performed.  Needle used: 25G, 2 inch.  Needle insertion: In-plane.  Imaging conditions: Good visualization of the muscle planes.  After disinfection of the skin with chlorhexidine of the general area and sterile draping, the needle was inserted under ultrasound guidance.  injection of a total of 10 ml of bupivacaine 0.25% at 10 trigger points.     The injections were performed at the following sites / muscle groups: multifidus, longissimus, trapezius, erector spinae     Uneventful removal of the needle.     POST-PROCEDURE / INTERPRETATION  Uneventful. Typically, the effect of trigger point injections cannot be evaluated immediately after procedure.     COMPLICATIONS  None.     PLAN  She will return for repeat TPIs.

## 2022-09-01 NOTE — Progress Notes (Signed)
Christina Brooks identified by name and DOB.   She confirmed she is having a Bilateral Neck and Upper Back TPI (2/6)  She was admitted to procedure area with steady gait.  She denies any possibility of being pregnant or currently/planning to breastfeed.   She has not fallen in the last 6 months.  Fall prevention interventions: Patient provided with non-skid stockings  She denies taking any blood thinners.  She denies having a bleeding disorder.  She  Has held her medications per provider instructions.  She has not been on antibiotics for the past two weeks.   She has a distant history of fainting during medical procedures.   She has not been NPO.  She undressed self without nursing assistance.  Is She receiving a steroid injection today? No  If yes, has She received the COVID-19 vaccine (either brand) within the past 2 weeks? NA  OR does She plan to receive the COVID-19 vaccine (either brand) within the next 2 weeks?NA  I verbally reviewed written discharge instructions and pain diary with her.   Signed consent for series not older than 90 days is scanned in the patient's chart:  Not applicable  Confirmed ride home with her husband, Larose Kells [phone]     She has a pre-procedure pain score of 6/10 Left Shoulder and Neck

## 2022-09-01 NOTE — Progress Notes (Signed)
Pain score prior to procedure:  6/10  Pain score after procedure:  7/10    Christina Brooks met discharge criteria:  A/OX4/baseline, VSS/returned to baseline. Mobility returned to baseline prior to discharge. Procedure site clean dry and intact.  Written and verbal discharge instructions, including emergency contact phone numbers, reviewed with Ms. Grullon. Verbal understanding obtained from her.  Ms. Parrack dressed self without nursing assistance. She was discharged in stable condition from procedure area with steady gait. to home with all belongings and with ride/responsible person.

## 2022-09-01 NOTE — Progress Notes (Signed)
I saw and evaluated the patient with Dr Lee, who conducted the initial history and physical examination.  I reviewed and confirmed all key elements and  I agree with the findings and the plan of care as documented in our notes. I did edit the trainee's note.      Adlene Adduci, MD  Assistant Professor

## 2022-09-01 NOTE — Patient Instructions (Signed)
[facility]  Post-Procedure Discharge Instructions      After the procedure:  Immediate and complete pain relief is rare  Numbness and/or weakness in the area of your body supplied by the injected nerve; these symptoms should resolve but may last up to several hours  Some soreness and bruising at the injection site(s)    Activities:  If you have any weakness or numbness caused by the injection, DO NOT DRIVE or operate machinery and limit other activity until sensation returns to normal.  You may resume regular exercises/activities as tolerated.  If you received sedation, DO NOT DRIVE or operate machinery for 24 hours.    Medications:  If you stopped taking any blood thinning medications such as Coumadin or Plavix, you may resume these tomorrow unless specified differently by the prescribing physician.    Site care:  You may remove the band-aid after 6 hours.  You may shower today. No swimming, tub baths or hot tubs for 24 hours following your procedure.  For the first 48 hours, apply ice packs to the injection site for 15-20 minutes hourly as needed for comfort.  Wrap a light towel or cloth around ice packs and heating pad to protect the skin.  After 48 hours, use a warm heating pad to the injection site for 15-20 minutes hourly as needed for comfort.    If you received steroids today:  Steroid medications may cause facial flushing, occasional low-grade fevers, hiccups, insomnia, headaches, water retention, increased appetite, increased heart rate, and abdominal cramping or bloating. These side effects occur in only about 5 percent of patients and commonly disappear within one to three days after the injection.  If you are diabetic check your blood sugar more frequently than usual as you may develop an increase in blood sugar for the next 10-14 days. Contact your diabetes physician if this occurs.    Call us if you develop any of the following symptoms in the next 7 days:  Fever above 100 degrees  F     Any unusual increase in your level of pain  Swelling, bleeding, redness, or increased tenderness at the procedure or IV site  Headache not relieved by Tylenol (if you had an epidural steroid injection)  .    Contact us:  Anytime, 24 hours/day, 7 days/week at [phone]

## 2022-09-01 NOTE — Telephone Encounter (Signed)
Procedure MD:  Dr. Bridgett Larsson  Procedure:  Trigger point injection of the multifidus, longissimus, trapezius, erector spinae ; side: bilateral.   Date of Procedure:  07/24/2023    Pain Scores:    TIME PAIN SCORE WITH PAINFUL ACTIVITY PAIN SCORE WITHOUT PAINFUL ACTIVITY   Before injections:   7 6   Immediately after injections:   7 6   30  minutes after injections:   7 6   1  hour after injections:   7 6   2  hours after injections:   7 6   3  hours after injections:   7 6   4  hours after injections:   7 6   5  hours after injections:   7 6   6  hours after injections:   7 6   When I had the lowest pain score, my % (percentage) pain relief:  NA% NA%     Follow Up: Next telemed appointment with Dr. Bridgett Larsson on 09/01/2022.  Christina Brooks's Comments:  Still the same but had a longer time without a flare up.

## 2022-09-03 ENCOUNTER — Telehealth (HOSPITAL_BASED_OUTPATIENT_CLINIC_OR_DEPARTMENT_OTHER): Payer: Self-pay

## 2022-09-03 NOTE — Telephone Encounter (Signed)
Called [facility] and the drug does not require a prior authorization. The drug is being sent to her.

## 2022-09-03 NOTE — Telephone Encounter (Signed)
Patient needs pregabalin prior authorization for [facility]    Patient needs prescription as soon as possible.

## 2022-09-07 ENCOUNTER — Telehealth (HOSPITAL_BASED_OUTPATIENT_CLINIC_OR_DEPARTMENT_OTHER): Payer: Self-pay | Admitting: Gynecology

## 2022-09-07 NOTE — Telephone Encounter (Signed)
RETURN CALL: Voicemail - Detailed Message      SUBJECT:  Appointment Request     REASON FOR VISIT: follow up  PREFERRED DATE/TIME: any  REASON UNABLE TO APPOINT: clinic not available

## 2022-09-11 DIAGNOSIS — F4310 Post-traumatic stress disorder, unspecified: Secondary | ICD-10-CM | POA: Insufficient documentation

## 2022-09-14 ENCOUNTER — Ambulatory Visit: Attending: Gynecology | Admitting: Gynecology

## 2022-09-14 VITALS — BP 119/76 | HR 72 | Ht 66.0 in | Wt 179.0 lb

## 2022-09-14 DIAGNOSIS — F409 Phobic anxiety disorder, unspecified: Secondary | ICD-10-CM | POA: Insufficient documentation

## 2022-09-14 DIAGNOSIS — N9489 Other specified conditions associated with female genital organs and menstrual cycle: Secondary | ICD-10-CM | POA: Insufficient documentation

## 2022-09-14 DIAGNOSIS — N809 Endometriosis, unspecified: Secondary | ICD-10-CM | POA: Insufficient documentation

## 2022-09-14 DIAGNOSIS — M26609 Unspecified temporomandibular joint disorder, unspecified side: Secondary | ICD-10-CM | POA: Insufficient documentation

## 2022-09-14 DIAGNOSIS — N941 Unspecified dyspareunia: Secondary | ICD-10-CM | POA: Insufficient documentation

## 2022-09-14 DIAGNOSIS — M5126 Other intervertebral disc displacement, lumbar region: Secondary | ICD-10-CM | POA: Insufficient documentation

## 2022-09-14 DIAGNOSIS — E559 Vitamin D deficiency, unspecified: Secondary | ICD-10-CM | POA: Insufficient documentation

## 2022-09-14 MED ORDER — RELUGOLIX-ESTRADIOL-NORETHIND 40-1-0.5 MG OR TABS
1.0000 | ORAL_TABLET | Freq: Every day | ORAL | 11 refills | Status: DC
Start: 2022-09-14 — End: 2023-09-01
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
## 2022-09-22 ENCOUNTER — Encounter (HOSPITAL_BASED_OUTPATIENT_CLINIC_OR_DEPARTMENT_OTHER): Payer: Self-pay | Admitting: Nursing

## 2022-09-25 ENCOUNTER — Telehealth (HOSPITAL_BASED_OUTPATIENT_CLINIC_OR_DEPARTMENT_OTHER): Payer: Self-pay

## 2022-09-25 NOTE — Telephone Encounter (Signed)
TC to [facility] to ask the status of Myfembree prescription that was prescribed by Dr. Debbrah Alar. Per pharmacist, clarification was needed because patient recently refilled Orlissa. MA clarified that patient is to only be taking Myfembree, but may have refilled Orlissa before knowing that her Clearence Ped has been authorized by her insurance. Pharmacy will work on stopping the fill of Orlissa and filling the Myfembree for patient.     Mychart msg sent to patient updating her on status of myfembree.

## 2022-09-25 NOTE — Telephone Encounter (Signed)
See TE to [facility]

## 2022-09-28 ENCOUNTER — Encounter (HOSPITAL_BASED_OUTPATIENT_CLINIC_OR_DEPARTMENT_OTHER): Payer: Self-pay | Admitting: Gynecology

## 2022-09-28 NOTE — Progress Notes (Signed)
ID/CC: 32yo F with myofascial pain s/p trigger point injections (last 09/01/22) with good relief, returns for repeat TPIs.  Also taking pregabalin 78m BID and flexeril 540mTID prn    TRIGGER POINT INJECTION WITH ULTRASOUND GUIDANCE     PROCEDURE  Trigger point injection of the multifidus, longissimus, trapezius, erector spinae ; side: bilateral.     PRE-PROCEDURE DIAGNOSIS  (M79.18) Myofascial pain.     POST-PROCEDURE DIAGNOSIS  (M79.18) Myofascial pain.     INDICATION FOR INJECTION  Chronic upper and lower back pain with suspected myofascial components.     CONSENT:    Potential risks including pain, serious infection, seizure, vessel or nerve injury, allergic reaction, hypertensive crisis, vasovagal reaction and others were discussed with the patient. The patient was given and read a patient information sheet regarding injection procedures prior to today's injection. The patient understands the benefits, risks, and alternatives of the procedure and agrees to the procedure today.  Written informed consent was obtained.      ATTENDING PHYSICIAN  YiRosette RevealMD     FELLOW / RESIDENT  YuBarbette MerinoMD      SEDATION  None.     NPO STATUS  None.     INTRAVENOUS LINE  None.     DESCRIPTION OF THE PROCEDURE  Position: Prone.  Imaging technique: ltrasound,  15-6 MHz transducer.  Time-out: Performed.  Needle used: 25G, 2 inch.  Needle insertion: In-plane.  Imaging conditions: Good visualization of the muscle planes.  After disinfection of the skin with chlorhexidine of the general area and sterile draping, the needle was inserted under ultrasound guidance.  injection of a total of 10 ml of bupivacaine 0.25% at 10 trigger points.     The injections were performed at the following sites / muscle groups: multifidus, longissimus, trapezius, erector spinae     Uneventful removal of the needle.     POST-PROCEDURE / INTERPRETATION  Uneventful. Typically, the effect of trigger point injections cannot be evaluated immediately after  procedure.     COMPLICATIONS  None.     PLAN  She will return for repeat TPIs.

## 2022-09-29 ENCOUNTER — Ambulatory Visit: Attending: Anesthesiology | Admitting: Anesthesiology

## 2022-09-29 VITALS — BP 122/73 | HR 95 | Temp 98.2°F | Resp 16

## 2022-09-29 DIAGNOSIS — M7918 Myalgia, other site: Secondary | ICD-10-CM | POA: Insufficient documentation

## 2022-09-29 NOTE — Patient Instructions (Signed)
[facility]  Post-Procedure Discharge Instructions      After the procedure:  Immediate and complete pain relief is rare  Numbness and/or weakness in the area of your body supplied by the injected nerve; these symptoms should resolve but may last up to several hours  Some soreness and bruising at the injection site(s)    Activities:  If you have any weakness or numbness caused by the injection, DO NOT DRIVE or operate machinery and limit other activity until sensation returns to normal.  You may resume regular exercises/activities as tolerated.  If you received sedation, DO NOT DRIVE or operate machinery for 24 hours.    Medications:  If you stopped taking any blood thinning medications such as Coumadin or Plavix, you may resume these tomorrow unless specified differently by the prescribing physician.    Site care:  You may remove the band-aid after 6 hours.  You may shower today. No swimming, tub baths or hot tubs for 24 hours following your procedure.  For the first 48 hours, apply ice packs to the injection site for 15-20 minutes hourly as needed for comfort.  Wrap a light towel or cloth around ice packs and heating pad to protect the skin.  After 48 hours, use a warm heating pad to the injection site for 15-20 minutes hourly as needed for comfort.    If you received steroids today:  Steroid medications may cause facial flushing, occasional low-grade fevers, hiccups, insomnia, headaches, water retention, increased appetite, increased heart rate, and abdominal cramping or bloating. These side effects occur in only about 5 percent of patients and commonly disappear within one to three days after the injection.  If you are diabetic check your blood sugar more frequently than usual as you may develop an increase in blood sugar for the next 10-14 days. Contact your diabetes physician if this occurs.    Call us if you develop any of the following symptoms in the next 7 days:  Fever above 100 degrees  F     Any unusual increase in your level of pain  Swelling, bleeding, redness, or increased tenderness at the procedure or IV site  Headache not relieved by Tylenol (if you had an epidural steroid injection)  .    Contact us:  Anytime, 24 hours/day, 7 days/week at [phone]

## 2022-09-29 NOTE — Progress Notes (Signed)
TRIGGER POINT INJECTION WITH ULTRASOUND GUIDANCE     PROCEDURE  Trigger point injection of the multifidus, longissimus, trapezius ; side: bilateral.     PRE-PROCEDURE DIAGNOSIS  (M79.18) Myofascial pain.     POST-PROCEDURE DIAGNOSIS  (M79.18) Myofascial pain.     INDICATION FOR INJECTION  Chronic upper and lower back pain with suspected myofascial components.     CONSENT:    Potential risks including pain, serious infection, seizure, vessel or nerve injury, allergic reaction, hypertensive crisis, vasovagal reaction and others were discussed with the patient. The patient was given and read a patient information sheet regarding injection procedures prior to today's injection. The patient understands the benefits, risks, and alternatives of the procedure and agrees to the procedure today.  Written informed consent was obtained.      ATTENDING PHYSICIAN  Rosette Reveal, MD     FELLOW / RESIDENT  None     SEDATION  None.     NPO STATUS  None.     INTRAVENOUS LINE  None.     DESCRIPTION OF THE PROCEDURE  Position: Prone.  Imaging technique: ltrasound,  15-6 MHz transducer.  Time-out: Performed.  Needle used: 25G, 2 inch.  Needle insertion: In-plane.  Imaging conditions: Good visualization of the muscle planes.    After disinfection of the skin with chlorhexidine of the general area and sterile draping, the needle was inserted under ultrasound guidance.  injection of a total of 10 ml of bupivacaine 0.25% at 10 trigger points.     The injections were performed at the following sites / muscle groups: multifidus, longissimus, trapezius     Uneventful removal of the needle.     POST-PROCEDURE / INTERPRETATION  Uneventful. Typically, the effect of trigger point injections cannot be evaluated immediately after procedure.     COMPLICATIONS  None.     PLAN  She will return for repeat TPIs

## 2022-09-29 NOTE — Progress Notes (Signed)
Ms. Christina Brooks identified by name and DOB.   She confirmed she is having a bilateral neck and upper back TPI, 3 of 6  She was admitted to procedure area with steady gait.  She denies any possibility of being pregnant or currently/planning to breastfeed.   She has not fallen in the last 6 months.  Fall prevention interventions: Patient wearing shoes  She denies taking any blood thinners.  She denies having a bleeding disorder.  She has held her medications per provider instructions.  She has not been on antibiotics for the past two weeks.   She has a history of fainting during blood draws when younger.  She has not been NPO.  She undressed self without nursing assistance.  Is she receiving a steroid injection today? No  If yes, has she received the COVID-19 vaccine (either brand) within the past 2 weeks? N/A  OR does she plan to receive the COVID-19 vaccine (either brand) within the next 2 weeks? N/A  I verbally reviewed written discharge instructions and pain diary with her.   Signed consent for series not older than 90 days is scanned in the patient's chart: No  Confirmed ride home with herself, ok per order     She has a pre-procedure pain score of 4/10 bilateral back of head to mid back    Pain score prior to procedure:  4/10  Pain score after procedure:  5/10    Ms. Christina Brooks met discharge criteria:  A/OX4/baseline, VSS/returned to baseline. Mobility returned to baseline prior to discharge. Procedure site clean dry and intact.  Written and verbal discharge instructions, including emergency contact phone numbers, reviewed with Ms. Christina Brooks. Verbal understanding obtained from her.  Ms. Christina Brooks dressed self without nursing assistance. She was discharged in stable condition from procedure area with steady gait. to home with all belongings and with ride/responsible person.

## 2022-10-27 ENCOUNTER — Ambulatory Visit: Attending: Anesthesiology | Admitting: Anesthesiology

## 2022-10-27 VITALS — BP 118/82 | HR 84 | Temp 97.9°F | Resp 16

## 2022-10-27 DIAGNOSIS — M7918 Myalgia, other site: Secondary | ICD-10-CM | POA: Insufficient documentation

## 2022-10-27 NOTE — Patient Instructions (Signed)
[facility]  Post-Procedure Discharge Instructions      After the procedure:  Immediate and complete pain relief is rare  Numbness and/or weakness in the area of your body supplied by the injected nerve; these symptoms should resolve but may last up to several hours  Some soreness and bruising at the injection site(s)    Activities:  If you have any weakness or numbness caused by the injection, DO NOT DRIVE or operate machinery and limit other activity until sensation returns to normal.  You may resume regular exercises/activities as tolerated.  If you received sedation, DO NOT DRIVE or operate machinery for 24 hours.    Medications:  If you stopped taking any blood thinning medications such as Coumadin or Plavix, you may resume these tomorrow unless specified differently by the prescribing physician.    Site care:  You may remove the band-aid after 6 hours.  You may shower today. No swimming, tub baths or hot tubs for 24 hours following your procedure.  For the first 48 hours, apply ice packs to the injection site for 15-20 minutes hourly as needed for comfort.  Wrap a light towel or cloth around ice packs and heating pad to protect the skin.  After 48 hours, use a warm heating pad to the injection site for 15-20 minutes hourly as needed for comfort.    If you received steroids today:  Steroid medications may cause facial flushing, occasional low-grade fevers, hiccups, insomnia, headaches, water retention, increased appetite, increased heart rate, and abdominal cramping or bloating. These side effects occur in only about 5 percent of patients and commonly disappear within one to three days after the injection.  If you are diabetic check your blood sugar more frequently than usual as you may develop an increase in blood sugar for the next 10-14 days. Contact your diabetes physician if this occurs.    Call us if you develop any of the following symptoms in the next 7 days:  Fever above 100 degrees  F     Any unusual increase in your level of pain  Swelling, bleeding, redness, or increased tenderness at the procedure or IV site  Headache not relieved by Tylenol (if you had an epidural steroid injection)  .    Contact us:  Anytime, 24 hours/day, 7 days/week at [phone]

## 2022-10-27 NOTE — Progress Notes (Signed)
Christina Brooks identified by name and DOB.   She confirmed she is having a bilateral neck and upper back TPI, 4 of 6  She was admitted to procedure area with steady gait.  She denies any possibility of being pregnant or currently/planning to breastfeed.   She has not fallen in the last 6 months.  Fall prevention interventions: Patient wearing shoes  She denies taking any blood thinners.  She denies having a bleeding disorder.  She has held her medications per provider instructions.  She has not been on antibiotics for the past two weeks.   She has a distant a history of fainting during medical procedures (blood draw)  She has not been NPO.  She undressed self without nursing assistance.  Is she receiving a steroid injection today? No  If yes, has she received the COVID-19 vaccine (either brand) within the past 2 weeks? N/A  OR does she plan to receive the COVID-19 vaccine (either brand) within the next 2 weeks? N/A  I verbally reviewed written discharge instructions and pain diary with her.   Signed consent for series not older than 90 days is scanned in the patient's chart: No  Confirmed ride home with herself, ok per order     She has a pre-procedure pain score of 4-5/10 bilateral neck and upper back L>R    Pain score prior to procedure:  5/10  Pain score after procedure:  6/10    Christina Brooks met discharge criteria:  A/OX4/baseline, VSS/returned to baseline. Mobility returned to baseline prior to discharge. Procedure site clean dry and intact.  Written and verbal discharge instructions, including emergency contact phone numbers, reviewed with Christina Brooks. Verbal understanding obtained from her.  Christina Brooks dressed self without nursing assistance. She was discharged in stable condition from procedure area with steady gait. to home with all belongings and with ride/responsible person.

## 2022-10-28 NOTE — Progress Notes (Signed)
TRIGGER POINT INJECTION WITH ULTRASOUND GUIDANCE     PROCEDURE  Trigger point injection of the trapezius, cervical paraspinal muscles; side: bilateral.     PRE-PROCEDURE DIAGNOSIS  (M79.18) Myofascial pain.     POST-PROCEDURE DIAGNOSIS  (M79.18) Myofascial pain.     INDICATION FOR INJECTION  Chronic upper and lower back pain with suspected myofascial components.     CONSENT:    Potential risks including pain, serious infection, seizure, vessel or nerve injury, allergic reaction, hypertensive crisis, vasovagal reaction and others were discussed with the patient. The patient was given and read a patient information sheet regarding injection procedures prior to today's injection. The patient understands the benefits, risks, and alternatives of the procedure and agrees to the procedure today.  Written informed consent was obtained.      ATTENDING PHYSICIAN  Boston Service, MD     FELLOW / RESIDENT  None     SEDATION  None.     NPO STATUS  None.     INTRAVENOUS LINE  None.     DESCRIPTION OF THE PROCEDURE  Position: Prone.  Imaging technique: ltrasound,  15-6 MHz transducer.  Time-out: Performed.  Needle used: 25G, 2 inch.  Needle insertion: In-plane.  Imaging conditions: Good visualization of the muscle planes.     After disinfection of the skin with chlorhexidine of the general area and sterile draping, the needle was inserted under ultrasound guidance.  injection of a total of 10 ml of bupivacaine 0.25% at 10 trigger points.     The injections were performed at the following sites / muscle groups: trapezius, splenius capitis, rhomboids     Uneventful removal of the needle.     POST-PROCEDURE / INTERPRETATION  Uneventful. Typically, the effect of trigger point injections cannot be evaluated immediately after procedure.     COMPLICATIONS  None.     PLAN  She will return for repeat TPIs

## 2022-10-30 ENCOUNTER — Telehealth (HOSPITAL_BASED_OUTPATIENT_CLINIC_OR_DEPARTMENT_OTHER): Payer: Self-pay

## 2022-10-30 NOTE — Telephone Encounter (Signed)
RETURN CALL: Voicemail - Detailed Message      SUBJECT:  Referral Request/Questions - Incoming     REASON FOR REFERRAL: allergies and reactions  DATE SENT: 2/25  REFERRING CLINIC/PROVIDER: [facility]  QUESTIONS: Patient would like to know if she needs to have referral sent again or if the clinic received it

## 2022-11-10 ENCOUNTER — Encounter (HOSPITAL_BASED_OUTPATIENT_CLINIC_OR_DEPARTMENT_OTHER): Payer: Self-pay | Admitting: Gynecology

## 2022-11-10 DIAGNOSIS — N939 Abnormal uterine and vaginal bleeding, unspecified: Secondary | ICD-10-CM

## 2022-11-24 ENCOUNTER — Ambulatory Visit: Attending: Anesthesiology | Admitting: Anesthesiology

## 2022-11-24 VITALS — BP 115/70 | HR 82 | Temp 97.7°F | Resp 16

## 2022-11-24 DIAGNOSIS — M7918 Myalgia, other site: Secondary | ICD-10-CM | POA: Insufficient documentation

## 2022-11-24 NOTE — Patient Instructions (Signed)
[facility]  Post-Procedure Discharge Instructions      After the procedure:  Immediate and complete pain relief is rare  Numbness and/or weakness in the area of your body supplied by the injected nerve; these symptoms should resolve but may last up to several hours  Some soreness and bruising at the injection site(s)    Activities:  If you have any weakness or numbness caused by the injection, DO NOT DRIVE or operate machinery and limit other activity until sensation returns to normal.  You may resume regular exercises/activities as tolerated.  If you received sedation, DO NOT DRIVE or operate machinery for 24 hours.    Medications:  If you stopped taking any blood thinning medications such as Coumadin or Plavix, you may resume these tomorrow unless specified differently by the prescribing physician.    Site care:  You may remove the band-aid after 6 hours.  You may shower today. No swimming, tub baths or hot tubs for 24 hours following your procedure.  For the first 48 hours, apply ice packs to the injection site for 15-20 minutes hourly as needed for comfort.  Wrap a light towel or cloth around ice packs and heating pad to protect the skin.  After 48 hours, use a warm heating pad to the injection site for 15-20 minutes hourly as needed for comfort.    If you received steroids today:  Steroid medications may cause facial flushing, occasional low-grade fevers, hiccups, insomnia, headaches, water retention, increased appetite, increased heart rate, and abdominal cramping or bloating. These side effects occur in only about 5 percent of patients and commonly disappear within one to three days after the injection.  If you are diabetic check your blood sugar more frequently than usual as you may develop an increase in blood sugar for the next 10-14 days. Contact your diabetes physician if this occurs.    Call us if you develop any of the following symptoms in the next 7 days:  Fever above 100 degrees  F     Any unusual increase in your level of pain  Swelling, bleeding, redness, or increased tenderness at the procedure or IV site  Headache not relieved by Tylenol (if you had an epidural steroid injection)  .    Contact us:  Anytime, 24 hours/day, 7 days/week at [phone]

## 2022-11-24 NOTE — Progress Notes (Signed)
Pain score prior to procedure:  4/10  Pain score after procedure:  5/10    Ms. Christina Brooks met discharge criteria:  A/OX4/baseline, VSS/returned to baseline. Mobility returned to baseline prior to discharge. Procedure site clean dry and intact.  Written and verbal discharge instructions, including emergency contact phone numbers, reviewed with Ms. Christina Brooks. Verbal understanding obtained from her.  Ms. Christina Brooks dressed self without nursing assistance. She was discharged in stable condition from procedure area with steady gait. to home with all belongings and with ride/responsible person.

## 2022-11-24 NOTE — Progress Notes (Deleted)
TRIGGER POINT INJECTION WITH ULTRASOUND GUIDANCE     PROCEDURE  Trigger point injection of the multifidus, longissimus, trapezius ; side: bilateral.     PRE-PROCEDURE DIAGNOSIS  (M79.18) Myofascial pain.     POST-PROCEDURE DIAGNOSIS  (M79.18) Myofascial pain.     INDICATION FOR INJECTION  Chronic upper and lower back pain with suspected myofascial components.     CONSENT:    Potential risks including pain, serious infection, seizure, vessel or nerve injury, allergic reaction, hypertensive crisis, vasovagal reaction and others were discussed with the patient. The patient was given and read a patient information sheet regarding injection procedures prior to today's injection. The patient understands the benefits, risks, and alternatives of the procedure and agrees to the procedure today.  Written informed consent was obtained.      ATTENDING PHYSICIAN  Boston Service, MD     FELLOW / RESIDENT  Suzan Slick, MD     SEDATION  None.     NPO STATUS  None.     INTRAVENOUS LINE  None.     DESCRIPTION OF THE PROCEDURE  Position: Prone.  Imaging technique: ltrasound,  15-6 MHz transducer.  Time-out: Performed.  Needle used: 25G, 2 inch.  Needle insertion: In-plane.  Imaging conditions: Good visualization of the muscle planes.    After disinfection of the skin with chlorhexidine of the general area and sterile draping, the needle was inserted under ultrasound guidance.  injection of a total of 10*** ml of bupivacaine 0.25% at 10*** trigger points.     The injections were performed at the following sites / muscle groups: multifidus, longissimus, trapezius***     Uneventful removal of the needle.     POST-PROCEDURE / INTERPRETATION  Uneventful. Typically, the effect of trigger point injections cannot be evaluated immediately after procedure.     COMPLICATIONS  None.***     PLAN  She will return for repeat TPIs***

## 2022-11-24 NOTE — Progress Notes (Signed)
TRIGGER POINT INJECTION WITH ULTRASOUND GUIDANCE     PROCEDURE  Trigger point injection of the trapezius, cervical paraspinal muscles; side: bilateral.     PRE-PROCEDURE DIAGNOSIS  (M79.18) Myofascial pain.     POST-PROCEDURE DIAGNOSIS  (M79.18) Myofascial pain.     INDICATION FOR INJECTION  Chronic upper and lower back pain with suspected myofascial components.     CONSENT:    Potential risks including pain, serious infection, seizure, vessel or nerve injury, allergic reaction, hypertensive crisis, vasovagal reaction and others were discussed with the patient. The patient was given and read a patient information sheet regarding injection procedures prior to today's injection. The patient understands the benefits, risks, and alternatives of the procedure and agrees to the procedure today.  Written informed consent was obtained.      ATTENDING PHYSICIAN  Lateefa Crosby, MD     FELLOW / RESIDENT  None     SEDATION  None.     NPO STATUS  None.     INTRAVENOUS LINE  None.     DESCRIPTION OF THE PROCEDURE  Position: Prone.  Imaging technique: ltrasound,  15-6 MHz transducer.  Time-out: Performed.  Needle used: 25G, 2 inch.  Needle insertion: In-plane.  Imaging conditions: Good visualization of the muscle planes.     After disinfection of the skin with chlorhexidine of the general area and sterile draping, the needle was inserted under ultrasound guidance.  injection of a total of 10 ml of bupivacaine 0.25% at 10 trigger points.     The injections were performed at the following sites / muscle groups: trapezius, splenius capitis, rhomboids     Uneventful removal of the needle.     POST-PROCEDURE / INTERPRETATION  Uneventful. Typically, the effect of trigger point injections cannot be evaluated immediately after procedure.     COMPLICATIONS  None.     PLAN  She will return for repeat TPIs

## 2022-11-24 NOTE — Progress Notes (Signed)
Ms. Eddie identified by name and DOB.   She confirmed she is having a Bilateral Neck and Upper back TPI, 5 of 6  She was admitted to procedure area with steady gait.  She denies any possibility of being pregnant or currently/planning to breastfeed.   She has not fallen in the last 6 months.  Fall prevention interventions: Patient wearing shoes  She denies taking any blood thinners.  She denies having a bleeding disorder.  She  Has held her medications per provider instructions.  She has not been on antibiotics for the past two weeks.   She denies a history of fainting during medical procedures.   She has not been NPO.  She undressed self without nursing assistance.  Is She receiving a steroid injection today? no  If yes, has She received the COVID-19 vaccine (either brand) within the past 2 weeks? no  OR does She plan to receive the COVID-19 vaccine (either brand) within the next 2 weeks?no  I verbally reviewed written discharge instructions and pain diary with her.   Signed consent for series not older than 90 days is scanned in the patient's chart:  No      Confirmed ride home with her self     She has a pre-procedure pain score of 4 across shoulders and low back and hip tightness

## 2022-11-30 ENCOUNTER — Encounter (HOSPITAL_BASED_OUTPATIENT_CLINIC_OR_DEPARTMENT_OTHER): Payer: Self-pay | Admitting: Gynecology

## 2022-11-30 ENCOUNTER — Ambulatory Visit
Admission: RE | Admit: 2022-11-30 | Discharge: 2022-11-30 | Disposition: A | Discharge status: Home / Self Care | Attending: Diagnostic Radiology | Admitting: Diagnostic Radiology

## 2022-11-30 DIAGNOSIS — N939 Abnormal uterine and vaginal bleeding, unspecified: Secondary | ICD-10-CM | POA: Insufficient documentation

## 2022-12-22 ENCOUNTER — Ambulatory Visit: Attending: Anesthesiology | Admitting: Anesthesiology

## 2022-12-22 VITALS — BP 117/75 | HR 72 | Temp 99.1°F | Resp 16

## 2022-12-22 DIAGNOSIS — M7918 Myalgia, other site: Secondary | ICD-10-CM | POA: Insufficient documentation

## 2022-12-22 NOTE — Progress Notes (Signed)
Pain score prior to procedure:  4/10  Pain score after procedure:  6/10    Christina Brooks met discharge criteria:  A/OX4/baseline, VSS/returned to baseline. Mobility returned to baseline prior to discharge. Procedure site clean dry and intact.  Written and verbal discharge instructions, including emergency contact phone numbers, reviewed with Christina Brooks. Verbal understanding obtained from her.    Christina Brooks dressed self without nursing assistance. She was discharged in stable condition from procedure area with steady gait. to home with all belongings and with ride/responsible person, self

## 2022-12-22 NOTE — Progress Notes (Unsigned)
Christina Brooks identified by name and DOB.   She confirmed she is having a Bilateral Neck and Upper Back TPI (6/6)  She was admitted to procedure area with steady gait.  She denies any possibility of being pregnant or currently/planning to breastfeed.   She has not fallen in the last 6 months.  Fall prevention interventions: Patient provided with non-skid stockings  She denies taking any blood thinners.  She denies having a bleeding disorder.  She  Has held her medications per provider instructions.  She has not been on antibiotics for the past two weeks.   She has a distant a history of fainting during medical procedures.   She has not been NPO.  She undressed self without nursing assistance.  Is She receiving a steroid injection today? No  If yes, has She received the COVID-19 vaccine (either brand) within the past 2 weeks? NA  OR does She plan to receive the COVID-19 vaccine (either brand) within the next 2 weeks?NA  I verbally reviewed written discharge instructions and pain diary with her.   Signed consent for series not older than 90 days is scanned in the patient's chart:  Yes     Confirmed ride home driving herself no escort required      She has a pre-procedure pain score of 4/10 Bilateral Neck and Upper Back

## 2022-12-22 NOTE — Patient Instructions (Signed)
[facility]  Post-Procedure Discharge Instructions      After the procedure:  Immediate and complete pain relief is rare  Numbness and/or weakness in the area of your body supplied by the injected nerve; these symptoms should resolve but may last up to several hours  Some soreness and bruising at the injection site(s)    Activities:  If you have any weakness or numbness caused by the injection, DO NOT DRIVE or operate machinery and limit other activity until sensation returns to normal.  You may resume regular exercises/activities as tolerated.  If you received sedation, DO NOT DRIVE or operate machinery for 24 hours.    Medications:  If you stopped taking any blood thinning medications such as Coumadin or Plavix, you may resume these tomorrow unless specified differently by the prescribing physician.    Site care:  You may remove the band-aid after 6 hours.  You may shower today. No swimming, tub baths or hot tubs for 24 hours following your procedure.  For the first 48 hours, apply ice packs to the injection site for 15-20 minutes hourly as needed for comfort.  Wrap a light towel or cloth around ice packs and heating pad to protect the skin.  After 48 hours, use a warm heating pad to the injection site for 15-20 minutes hourly as needed for comfort.    If you received steroids today:  Steroid medications may cause facial flushing, occasional low-grade fevers, hiccups, insomnia, headaches, water retention, increased appetite, increased heart rate, and abdominal cramping or bloating. These side effects occur in only about 5 percent of patients and commonly disappear within one to three days after the injection.  If you are diabetic check your blood sugar more frequently than usual as you may develop an increase in blood sugar for the next 10-14 days. Contact your diabetes physician if this occurs.    Call us if you develop any of the following symptoms in the next 7 days:  Fever above 100 degrees  F     Any unusual increase in your level of pain  Swelling, bleeding, redness, or increased tenderness at the procedure or IV site  Headache not relieved by Tylenol (if you had an epidural steroid injection)  .    Contact us:  Anytime, 24 hours/day, 7 days/week at [phone]          [facility]  Patient Self-Administered Pain Diary  1 week        The procedure you just had was done in hopes of providing long term pain relief. Depending on the type of procedure you had, you will need to answer the questions below 1 week after your procedure.  Accurate completion and timely reporting of your pain diary(s) enables your Pain MD to review the results and make further treatment recommendations.    1 week after your procedure, write your answers to the 4 questions below and send your pain score diary to us either by:      1)  Sending a picture via eCare    2)  Faxing to [phone]  3)  Calling the pain score voicemail line at [phone].  Leave the spelling of your first and last name, U#, date of birth and date of procedure and the answers to all of the 4 questions below.             Use this scale to rate your pain  0 1 2 3 4 5 6 7   8 9 10  No pain                      worst pain        Procedure Date: 12/22/22    1.  Pain score immediately before procedure: __________    2. 1 week after your procedure, do you feel better?  YES/NO    3.  If you answered YES to the above, by what percentage__________% (0-100%) has your pain been relieved.    4.  Pain Score now: _________ (at time of telephone call)

## 2022-12-22 NOTE — Progress Notes (Unsigned)
TRIGGER POINT INJECTION WITH ULTRASOUND GUIDANCE     PROCEDURE  Trigger point injection of the trapezius, cervical paraspinal muscles; side: bilateral.     PRE-PROCEDURE DIAGNOSIS  (M79.18) Myofascial pain.     POST-PROCEDURE DIAGNOSIS  (M79.18) Myofascial pain.     INDICATION FOR INJECTION  Chronic upper and lower back pain with suspected myofascial components.     CONSENT:    Potential risks including pain, serious infection, seizure, vessel or nerve injury, allergic reaction, hypertensive crisis, vasovagal reaction and others were discussed with the patient. The patient was given and read a patient information sheet regarding injection procedures prior to today's injection. The patient understands the benefits, risks, and alternatives of the procedure and agrees to the procedure today.  Written informed consent was obtained.      ATTENDING PHYSICIAN  Boston Service, MD     FELLOW / RESIDENT  None     SEDATION  None.     NPO STATUS  None.     INTRAVENOUS LINE  None.     DESCRIPTION OF THE PROCEDURE  Position: Prone.  Imaging technique: ltrasound,  15-6 MHz transducer.  Time-out: Performed.  Needle used: 25G, 2 inch.  Needle insertion: In-plane.  Imaging conditions: Good visualization of the muscle planes.     After disinfection of the skin with chlorhexidine of the general area and sterile draping, the needle was inserted under ultrasound guidance.  injections of a total of 10 ml of bupivacaine 0.25% at 10 trigger points occurred without complications.     The injections were performed at the following sites / muscle groups: trapezius, splenius capitis, rhomboids     Uneventful removal of the needle.     POST-PROCEDURE / INTERPRETATION  Uneventful. Typically, the effect of trigger point injections cannot be evaluated immediately after procedure.     COMPLICATIONS  None.     PLAN  She will return for repeat TPIs    Boston Service, MD

## 2023-01-02 ENCOUNTER — Encounter (HOSPITAL_BASED_OUTPATIENT_CLINIC_OR_DEPARTMENT_OTHER): Payer: Self-pay | Admitting: Anesthesiology

## 2023-01-05 NOTE — Telephone Encounter (Signed)
Procedure MD:  Dr. Imogene Burn  Procedure:  Bilateral Neck and Upper Back TPI (6/6)   Date of Procedure:  12/22/22    Pain Scores: Patient scanned. Please see link under 'Encounter Images' or EPIC Media    Follow Up: None  Ms. Shiveley's Comments:  See E-Care message
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
## 2023-01-18 ENCOUNTER — Ambulatory Visit: Attending: Obstetrics/Gynecology | Admitting: Obstetrics/Gynecology

## 2023-01-18 ENCOUNTER — Encounter (HOSPITAL_BASED_OUTPATIENT_CLINIC_OR_DEPARTMENT_OTHER): Payer: Self-pay | Admitting: Obstetrics/Gynecology

## 2023-01-18 VITALS — BP 120/86 | HR 76 | Ht 66.0 in | Wt 182.0 lb

## 2023-01-18 DIAGNOSIS — M62838 Other muscle spasm: Secondary | ICD-10-CM | POA: Insufficient documentation

## 2023-01-18 DIAGNOSIS — N9419 Other specified dyspareunia: Secondary | ICD-10-CM | POA: Insufficient documentation

## 2023-01-18 DIAGNOSIS — N941 Unspecified dyspareunia: Secondary | ICD-10-CM | POA: Insufficient documentation

## 2023-01-18 DIAGNOSIS — N952 Postmenopausal atrophic vaginitis: Secondary | ICD-10-CM | POA: Insufficient documentation

## 2023-01-18 DIAGNOSIS — R102 Pelvic and perineal pain: Secondary | ICD-10-CM | POA: Insufficient documentation

## 2023-01-18 MED ORDER — ESTRADIOL 0.1 MG/GM VA CREA
1.0000 g | TOPICAL_CREAM | VAGINAL | 3 refills | Status: DC
Start: 2023-01-18 — End: 2023-04-01

## 2023-01-18 NOTE — Progress Notes (Signed)
I saw and evaluated the patient. I have reviewed the resident(s)'s/fellow(s)'s documentation and agree.     Patient with dyspareunia likely due to mostly levator spasm and possibly some atrophic vaginitis 2/2 endometriosis treatment.  I recommend continuing pelvic floor PT with trigger point injections to facilitate PT, with the plan to repeat as needed or add botox injections if needed. I also recommend using vaginal estrogen to decrease irritation over introitus.    OR request placed for trigger point injections in the OR.

## 2023-01-22 ENCOUNTER — Other Ambulatory Visit (HOSPITAL_COMMUNITY): Payer: Self-pay

## 2023-02-16 ENCOUNTER — Ambulatory Visit: Admitting: Family Medicine

## 2023-02-19 ENCOUNTER — Telehealth (HOSPITAL_BASED_OUTPATIENT_CLINIC_OR_DEPARTMENT_OTHER): Payer: Self-pay | Admitting: Gynecology

## 2023-02-19 NOTE — Telephone Encounter (Signed)
Patient was on paxlovid for covid infection and was told to stop taking the myfembree. She would like to know if there are any special instructions for re-starting the medication at this time. Ok to leave detailed vmail or send [employer].  CMc

## 2023-03-09 ENCOUNTER — Telehealth (HOSPITAL_BASED_OUTPATIENT_CLINIC_OR_DEPARTMENT_OTHER): Payer: Self-pay | Admitting: Obstetrics/Gynecology

## 2023-03-09 NOTE — Telephone Encounter (Signed)
RETURN CALL: Voicemail - Detailed Message      SUBJECT:  Medical Concerns/Information - Medical Concerns/Information      MESSAGE: Patient has questions about her prior authorization for her anesthesia for her procedure on 04/01/2023 with dr Craige Cotta - she wants to ensure her insurance is going to take care of it or if there is anything she needs to do on her end. Please advise, thank you!

## 2023-03-23 ENCOUNTER — Ambulatory Visit

## 2023-03-25 ENCOUNTER — Encounter (HOSPITAL_BASED_OUTPATIENT_CLINIC_OR_DEPARTMENT_OTHER): Payer: Self-pay | Admitting: Obstetrics/Gynecology

## 2023-03-25 ENCOUNTER — Other Ambulatory Visit: Payer: Self-pay

## 2023-03-25 ENCOUNTER — Encounter (HOSPITAL_COMMUNITY): Payer: Self-pay

## 2023-03-25 ENCOUNTER — Ambulatory Visit

## 2023-03-25 ENCOUNTER — Telehealth (HOSPITAL_BASED_OUTPATIENT_CLINIC_OR_DEPARTMENT_OTHER): Payer: Self-pay

## 2023-03-25 ENCOUNTER — Encounter (HOSPITAL_BASED_OUTPATIENT_CLINIC_OR_DEPARTMENT_OTHER): Payer: Self-pay | Admitting: Orthopaedic Surgery

## 2023-03-25 NOTE — Telephone Encounter (Signed)
RETURN CALL: Voicemail - Detailed Message      SUBJECT:  Appointment Request     REASON FOR VISIT: left hip pain  PREFERRED DATE/TIME: per clinic   REASON UNABLE TO APPOINT: Pt had surgery in 2022 at [facility] ortho group with Dr. Laveda Norman. Facility phone number is [phone]. [facility]

## 2023-03-25 NOTE — Telephone Encounter (Signed)
[facility] sent to pt as confirmation of receipt.  Chart forwarded to Dr Craige Cotta for recommendations
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
## 2023-03-25 NOTE — Preprocedure Instructions (Signed)
Pre-Surgery Instructions:   Medication Instructions    buPROPion XL 150 MG 24 hr tablet Take day of surgery    estradiol 0.1 MG/GM vaginal cream Per Physician instructions    relugolix-estradiol-norethindrone 40-1-0.5 MG tablet Per Physician instructions     Arrive on 04/01/23 at [facility]([address]) at 11:30 am for surgery.  Your surgery length is approximately 30 minutes.  The recovery period is variable, your ride will need to be available for pick up.  Times are subject to change. You will get a call the day before with your finalized check in time.    If your surgery time changes, you must adjust eating (no later than 8 hours before check in ) and drinking (no later than 2 hours before check in) to fit to below timelines, otherwise your surgery will be cancelled.    LAST MEAL:  Meal must be completed 8 hours before surgery check in!   No gum, candy, mints or cough drops.    LAST CLEAR LIQUID: (water, apple juice, black coffee-cannot have anything in it, no dairy, milk, creamer or sugar).  Must be completed 2 hours before surgery check in!    *Patients with diabetes who have delayed gastric emptying or gastroparesis may require longer fasting times.  Patients needing bowel prep may have different instructions.    You may brush your teeth and spit.    Shower the evening before and the morning of surgery (unless otherwise instructed by physician).  Use antibacterial soap if provided by the surgeon.  After your shower please do not put any products on the skin or hair including; lotions, powders, deodorant, antiperspirant, aftershave, fragrances, face cream, makeup, conditioners, gel, mousse or hairspray.  Do not shave the surgical site for 48 hours before surgery.      Wear loose comfortable clothing, you may wish to wear a button style shirt if your surgery is above the waist.     Please leave all jewelry at home, including wedding rings, watches,  necklaces and all piercings.  Plastic spacers are okay-except in mouth or in surgical area.      Please bring your insurance card and photo identification.    If you are running late the day of surgery, please call [phone].    If you have questions about surgery or aftercare please contact the surgeon.    If you have questions about anesthesia or day of surgery please call [phone].    Parking is $10 per day.  In/out of campus under 30 minutes is free.    Call done with patient 03/25/23

## 2023-03-25 NOTE — Telephone Encounter (Signed)
Called patient earlier and discuss referral. Advised that [facility] needs to send [facility] with images before moving forward with referral process.

## 2023-03-25 NOTE — Telephone Encounter (Signed)
See referral notes.   Called patient and filled out blue sheet

## 2023-03-26 NOTE — Telephone Encounter (Signed)
MCM to pt for med clarification about amoxicillin prescribed by her dentist

## 2023-03-30 ENCOUNTER — Other Ambulatory Visit (HOSPITAL_COMMUNITY): Payer: Self-pay

## 2023-03-30 NOTE — Telephone Encounter (Signed)
Referral going to [facility] 8/21

## 2023-03-30 NOTE — Telephone Encounter (Signed)
Disc received from [place] with MRI lower extremity 03/23/23. Uploaded via [employer], ready to view in PACS. Placed in imaging folder at front desk.

## 2023-04-01 ENCOUNTER — Encounter (HOSPITAL_BASED_OUTPATIENT_CLINIC_OR_DEPARTMENT_OTHER)
Admission: RE | Disposition: A | Discharge status: Home / Self Care | Payer: Self-pay | Source: Home / Self Care | Attending: Obstetrics/Gynecology

## 2023-04-01 ENCOUNTER — Ambulatory Visit (HOSPITAL_BASED_OUTPATIENT_CLINIC_OR_DEPARTMENT_OTHER): Admitting: Certified Registered"

## 2023-04-01 ENCOUNTER — Ambulatory Visit (HOSPITAL_BASED_OUTPATIENT_CLINIC_OR_DEPARTMENT_OTHER)

## 2023-04-01 ENCOUNTER — Encounter (HOSPITAL_BASED_OUTPATIENT_CLINIC_OR_DEPARTMENT_OTHER): Payer: Self-pay | Admitting: Obstetrics/Gynecology

## 2023-04-01 ENCOUNTER — Ambulatory Visit
Admission: RE | Admit: 2023-04-01 | Discharge: 2023-04-01 | Disposition: A | Discharge status: Home / Self Care | Attending: Obstetrics/Gynecology | Admitting: Obstetrics/Gynecology

## 2023-04-01 ENCOUNTER — Ambulatory Visit (HOSPITAL_BASED_OUTPATIENT_CLINIC_OR_DEPARTMENT_OTHER): Payer: Self-pay | Admitting: Obstetrics/Gynecology

## 2023-04-01 DIAGNOSIS — N941 Unspecified dyspareunia: Secondary | ICD-10-CM

## 2023-04-01 DIAGNOSIS — Z87828 Personal history of other (healed) physical injury and trauma: Secondary | ICD-10-CM | POA: Insufficient documentation

## 2023-04-01 DIAGNOSIS — F419 Anxiety disorder, unspecified: Secondary | ICD-10-CM | POA: Insufficient documentation

## 2023-04-01 DIAGNOSIS — R102 Pelvic and perineal pain: Secondary | ICD-10-CM | POA: Insufficient documentation

## 2023-04-01 DIAGNOSIS — Z9889 Other specified postprocedural states: Secondary | ICD-10-CM | POA: Insufficient documentation

## 2023-04-01 DIAGNOSIS — F32A Depression, unspecified: Secondary | ICD-10-CM | POA: Insufficient documentation

## 2023-04-01 DIAGNOSIS — Z79891 Long term (current) use of opiate analgesic: Secondary | ICD-10-CM | POA: Insufficient documentation

## 2023-04-01 DIAGNOSIS — Z01818 Encounter for other preprocedural examination: Secondary | ICD-10-CM

## 2023-04-01 DIAGNOSIS — M62838 Other muscle spasm: Secondary | ICD-10-CM

## 2023-04-01 DIAGNOSIS — Z79899 Other long term (current) drug therapy: Secondary | ICD-10-CM | POA: Insufficient documentation

## 2023-04-01 LAB — URINE PREGNANCY, POC (NWH): Urine Pregancy , POC (NWH): NEGATIVE

## 2023-04-01 SURGERY — INJECTION, BOTULINUM TOXIN, PELVIS, FLOOR
Anesthesia: General | Site: Pelvis | Laterality: Bilateral | Wound class: Class I/ Clean

## 2023-04-01 MED ORDER — BUPIVACAINE HCL (PF) 0.5 % IJ SOLN
INTRAMUSCULAR | Status: AC
Start: 2023-04-01 — End: 2023-04-01
  Filled 2023-04-01: qty 10

## 2023-04-01 MED ORDER — FENTANYL CITRATE (PF) 100 MCG/2ML IJ SOLN
25.0000 ug | INTRAMUSCULAR | Status: DC | PRN
Start: 2023-04-01 — End: 2023-04-01

## 2023-04-01 MED ORDER — ONDANSETRON HCL 4 MG/2ML IJ SOLN
4.0000 mg | INTRAMUSCULAR | Status: DC | PRN
Start: 2023-04-01 — End: 2023-04-01

## 2023-04-01 MED ORDER — OXYCODONE HCL 5 MG OR TABS
5.0000 mg | ORAL_TABLET | ORAL | Status: DC | PRN
Start: 2023-04-01 — End: 2023-04-01

## 2023-04-01 MED ORDER — BUPIVACAINE HCL (PF) 0.5 % IJ SOLN
INTRAMUSCULAR | Status: DC | PRN
Start: 2023-04-01 — End: 2023-04-01
  Administered 2023-04-01: 10 mL via INTRAMUSCULAR

## 2023-04-01 MED ORDER — ACETAMINOPHEN 500 MG OR TABS
1000.0000 mg | ORAL_TABLET | ORAL | Status: AC
Start: 2023-04-01 — End: 2023-04-01
  Administered 2023-04-01: 1000 mg via ORAL

## 2023-04-01 MED ORDER — LACTATED RINGERS BOLUS
500.0000 mL | Freq: Once | INTRAVENOUS | Status: DC | PRN
Start: 2023-04-01 — End: 2023-04-01

## 2023-04-01 MED ORDER — LACTATED RINGERS IV SOLN
100.0000 mL/h | INTRAVENOUS | Status: DC
Start: 2023-04-01 — End: 2023-04-01

## 2023-04-01 MED ORDER — APREPITANT 40 MG OR CAPS
ORAL_CAPSULE | ORAL | Status: AC
Start: 2023-04-01 — End: 2023-04-01
  Filled 2023-04-01: qty 1

## 2023-04-01 MED ORDER — LACTATED RINGERS IV SOLN
10.0000 mL/h | INTRAVENOUS | Status: DC
Start: 2023-04-01 — End: 2023-04-01
  Administered 2023-04-01: 10 mL/h via INTRAVENOUS

## 2023-04-01 MED ORDER — TRIAMCINOLONE ACETONIDE 40 MG/ML IJ SUSP
INTRAMUSCULAR | Status: AC
Start: 2023-04-01 — End: 2023-04-01
  Filled 2023-04-01: qty 2

## 2023-04-01 MED ORDER — ALBUTEROL SULFATE (2.5 MG/3ML) 0.083% IN NEBU
2.5000 mg | INHALATION_SOLUTION | Freq: Once | RESPIRATORY_TRACT | Status: DC | PRN
Start: 2023-04-01 — End: 2023-04-01

## 2023-04-01 MED ORDER — ONABOTULINUMTOXINA 100 UNITS IJ SOLR
INTRAMUSCULAR | Status: AC
Start: 2023-04-01 — End: 2023-04-01
  Filled 2023-04-01: qty 100

## 2023-04-01 MED ORDER — NALOXONE HCL 0.4 MG/ML IJ SOLN
0.0400 mg | INTRAMUSCULAR | Status: DC | PRN
Start: 2023-04-01 — End: 2023-04-01

## 2023-04-01 MED ORDER — LIDOCAINE HCL 1 % IJ SOLN
INTRAMUSCULAR | Status: DC | PRN
Start: 2023-04-01 — End: 2023-04-01
  Administered 2023-04-01: 50 mg via INTRAVENOUS

## 2023-04-01 MED ORDER — ACETAMINOPHEN 500 MG OR TABS
ORAL_TABLET | ORAL | Status: AC
Start: 2023-04-01 — End: 2023-04-01
  Filled 2023-04-01: qty 2

## 2023-04-01 MED ORDER — LIDOCAINE HCL 1 % IJ SOLN
INTRAMUSCULAR | Status: AC
Start: 2023-04-01 — End: 2023-04-01
  Filled 2023-04-01: qty 20

## 2023-04-01 MED ORDER — GLYCOPYRROLATE 0.2 MG/ML IJ SOLN
0.2000 mg | INTRAMUSCULAR | Status: DC | PRN
Start: 2023-04-01 — End: 2023-04-01

## 2023-04-01 MED ORDER — LIDOCAINE HCL (PF) 1 % IJ SOLN
0.3000 mL | INTRAMUSCULAR | Status: DC | PRN
Start: 2023-04-01 — End: 2023-04-01

## 2023-04-01 MED ORDER — LIDOCAINE HCL 1 % IJ SOLN
INTRAMUSCULAR | Status: DC | PRN
Start: 2023-04-01 — End: 2023-04-01
  Administered 2023-04-01: 10 mL via INTRAMUSCULAR

## 2023-04-01 MED ORDER — KETOROLAC TROMETHAMINE 30 MG/ML IJ SOLN
15.0000 mg | Freq: Once | INTRAMUSCULAR | Status: DC | PRN
Start: 2023-04-01 — End: 2023-04-01

## 2023-04-01 MED ORDER — PROPOFOL 10 MG/ML IV EMUL WRAPPER (OSM ONLY)
INTRAVENOUS | Status: DC | PRN
Start: 2023-04-01 — End: 2023-04-01
  Administered 2023-04-01: 100 mg via INTRAVENOUS
  Administered 2023-04-01: 20 mg via INTRAVENOUS
  Administered 2023-04-01: 150 ug/kg/min via INTRAVENOUS

## 2023-04-01 MED ORDER — METOCLOPRAMIDE HCL 5 MG/ML IJ SOLN
5.0000 mg | Freq: Once | INTRAMUSCULAR | Status: DC | PRN
Start: 2023-04-01 — End: 2023-04-01

## 2023-04-01 MED ORDER — APREPITANT 40 MG OR CAPS
40.0000 mg | ORAL_CAPSULE | Freq: Once | ORAL | Status: AC
Start: 2023-04-01 — End: 2023-04-01
  Administered 2023-04-01: 40 mg via ORAL

## 2023-04-01 MED ORDER — TRIAMCINOLONE ACETONIDE 40 MG/ML IJ SUSP
INTRAMUSCULAR | Status: DC | PRN
Start: 2023-04-01 — End: 2023-04-01
  Administered 2023-04-01: 80 mg

## 2023-04-01 MED ORDER — LIDOCAINE 4 % EX CREA
TOPICAL_CREAM | CUTANEOUS | Status: DC | PRN
Start: 2023-04-01 — End: 2023-04-01

## 2023-04-01 SURGICAL SUPPLY — 8 items
DRAPE URO CATCHER 64IN OPEN MESH FILTER FLEX DRAIN HOSE (Drape) ×1 IMPLANT
NEEDLE ENDO WILLIAMS  5FR (Needle) IMPLANT
NEEDLE HYPODERMIC SAFETY 18GA 1-1/2IN MAGELLAN (Needle) ×1 IMPLANT
NEEDLE HYPODERMIC SAFETY 25GA 1-1/2IN MAGELLAN (Needle) IMPLANT
PACK CUST CYSTO (Pack) IMPLANT
SYRINGE 10ML LL (Syringe) IMPLANT
SYRINGE 3ML LL (Syringe) ×1 IMPLANT
TRAY EPIDURAL 6IN 20GA PHARMASEAL PARACERV (Anesthesia) ×1 IMPLANT

## 2023-04-01 NOTE — Procedure Nursing Note (Signed)
PACU Summary, Outpatient Discharge:    Patient summary/synopsis  Level of consciousness: awake, alert, and oriented  Nausea:None  Dressing(s) are: n/a, incision(s) open to air and peri pad in place  Pain is present: Yes 1/10. Pain is tolerable or patient has good function: Yes  Oral fluids taken: Yes  Voiding Status: Voided  Precautions or restrictions: Fall    Home with: Written instructions.   Escort present:Yes

## 2023-04-01 NOTE — Discharge Instructions (Addendum)
Discharge Instructions: After Your Surgery  You've just had surgery. During surgery, you were given medicine called anesthesia to keep you relaxed and free of pain. After surgery, you may have some pain or nausea. This is common. Here are some tips for feeling better and getting well after surgery.   Going home  Your healthcare provider will show you how to take care of yourself when you go home. They'll also answer your questions. Have an adult family member or friend drive you home. For the first 24 hours after your surgery:   Don't drive or use heavy equipment.  Don't make important decisions or sign legal papers.  Don't drink alcohol.  Have someone stay with you, if needed. They can watch for problems and help keep you safe.    _____________________________________________________________________________    Extra Strength Tylenol is 500 mg and Normal dose is 325 mg.   1000 mg Tylenol given at 12:30 PM next dose can be given at 6:30 PM  Do not EXCEED 4,000 mg in 24 hr        ______________________________________________________________________________      POST ANESTHESIA GUIDELINES      You have received sedation or an anesthetic today for your surgery. Therefore there are some guidelines we ask that you follow for your safety and well-being:  You must have a responsible adult escort you home or the the place where you will stay while you recover from surgery.   You cannot drive yourself.  You cannot take a taxi or bus by yourself.   Your escort must help you get into your home or place of recovery and  help you settle in.     The First 24 Hours  We strongly advise that a responsible adult stay with you for 24 hours after discharge. This adult should be able to help take care of you at home or in your place of recovery. This is for you safety, in case you have any problems and need extra care after your surgery today. If you choose not to follow this advice, [facility] is not responsible for your safety.   You can  expect to have some pain and maybe some nausea after surgery. You may also be sleepy for the rest of the afternoon.   For 24 hours after having anesthesia, DO NOT:   Drive  Drink alcohol / No Smoking / No Vaping / No Marijuana Products or Recreational drugs of any kind.  Travel alone  Use machinery  Sign any legal papers  Be responsible for children, pets, or an adult who needs care
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
## 2023-04-01 NOTE — Op Note (Signed)
Gynecology Operative Note   Christina Brooks - DOB: 1991/06/08 (32 year old female) MRN: [id number]  Procedure Date: 04/01/2023 Location:[facility] OR       Preoperative Diagnosis:   Levator spasm [V40.981]  Pelvic pain in female [R10.2]  Dyspareunia in female [N94.10]    Post Operative Diagnosis:      * Levator spasm [X91.478]     * Pelvic pain in female [R10.2]     * Dyspareunia in female [N94.10]    Procedures Performed:  (Bilateral) PELVIC FLOOR TRIGGER POINT INJECTIONS, PUDENDAL NERVE BLOCKS      Surgeons:  Primary: Ailene Rud, MD    Anesthesia:  Anesthesia type not filed in the log.  EBL: less than mL   Specimens: None   Wound Class: Procedure(s):  PELVIC FLOOR TRIGGER POINT INJECTIONS - Wound Class: Class I/ Clean     Indication(s): Christina Brooks is a 32 year old female with levator spasm, pelvic pain, dyspareunia    Finding(s): unremarkable bimanual exam.     Procedure Details:  Anesthesia induced.  Vagina prepped. Pudendal nerve blocks performed bilaterally with lidocaine, and then bilateral trigger point injections performed with 10ml marcaine 0.5% marcaine/59ml 40mg /ml kenalog using a pudendal injection tray in her coccygeus, pubococcygeus, and puborectalis muscles.    I was present for the entire procedure.     Complications: None.     Condition: stable    Ailene Rud, MD, 04/01/2023 2:34 PM

## 2023-04-01 NOTE — Anesthesia Postprocedure Evaluation (Signed)
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]     * Pelvic pain in female [R10.2]     * Dyspareunia in female [N94.10]    Responsible Provider:   Sanjuana Letters, MD  ASA Status: 2     Vitals Value Taken Time   BP 113/85 04/01/23 1453   Temp 36.4 C 04/01/23 1453   Pulse 90 04/01/23 1453   SpO2 100 % 04/01/23 1453       Place of evaluation: PACU    Patient participation: patient participated    Level of consciousness: fully conscious    Patient pain control satisfaction: patient is satisfied with level of pain control    Airway patency: patent    Cardiovascular status during assessment: stable    Respiratory status during assessment: breathing comfortably    Anesthetic complications: no    Intravascular volume status assessment: euvolemic    Nausea / vomiting: patient is not experiencing nausea      Planned post-operative disposition at time of assessment: hospital discharge

## 2023-04-05 ENCOUNTER — Telehealth (HOSPITAL_BASED_OUTPATIENT_CLINIC_OR_DEPARTMENT_OTHER): Payer: Self-pay | Admitting: Obstetrics/Gynecology

## 2023-04-05 NOTE — Telephone Encounter (Signed)
Post-Op Phone Call - Called Christina Brooks  April 05, 2023     Spoke with:  Left VM  Surgery Date: 04/01/2023  Procedure Preformed: Pelvic Floor Trigger Point Injections - Bilateral      Plan:    Post-Op Plan: Follow up visit on 05/03/2023    Notes: Left [facility] as well

## 2023-04-21 ENCOUNTER — Other Ambulatory Visit (HOSPITAL_COMMUNITY): Payer: Self-pay

## 2023-05-03 ENCOUNTER — Ambulatory Visit: Admitting: Obstetrics/Gynecology

## 2023-05-03 DIAGNOSIS — N952 Postmenopausal atrophic vaginitis: Secondary | ICD-10-CM

## 2023-05-03 DIAGNOSIS — R102 Pelvic and perineal pain: Secondary | ICD-10-CM

## 2023-05-03 DIAGNOSIS — M62838 Other muscle spasm: Secondary | ICD-10-CM

## 2023-05-03 DIAGNOSIS — N941 Unspecified dyspareunia: Secondary | ICD-10-CM

## 2023-05-03 NOTE — Progress Notes (Signed)
Distant Site Telemedicine Encounter  I conducted this encounter via secure, live, face-to-face video conference with the patient. I reviewed the risks and benefits of telemedicine as pertinent to this visit and the patient agreed to proceed.    Provider Location: On-site location (clinic, hospital, on-site office)  Patient Location: At home    Present with patient: No one else present     [facility]  Chief Complaint   Patient presents with    Post-Op     Trigger point injection. Some relief, still pain with sex, still a tight rubber band.        History:  32 year old patient presents today for follow up for post op evaluation DOS 04/01/23 (Bilateral) PELVIC FLOOR TRIGGER POINT INJECTIONS, PUDENDAL NERVE BLOCKS .     Today, the patient states that she has general pain throughout the day, much improved with trigger point injections, but she still has shooting pains at the back entrance of the vagina during intercourse.     She states her pelvic muscles near posterior introitus feel like a tight band.    Working with pelvic floor PT.    No vulvar burning.      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    Plan:   Botox injections in clinic to treat levator spasm and continue pelvic floor PT.    If insurance does not approve, can do another round of PF trigger point injections in clinic.    Portions of this note were documented by Bernerd Pho, medical scribe, on 05/03/2023, with oversight by Dr. Ailene Rud, MD.

## 2023-05-03 NOTE — Progress Notes (Signed)
 I personally performed the physical examination and medical decision making. I have verified all of the scribe's documentation for this encounter.     Ailene Rud, MD

## 2023-05-17 ENCOUNTER — Telehealth (HOSPITAL_BASED_OUTPATIENT_CLINIC_OR_DEPARTMENT_OTHER): Payer: Self-pay

## 2023-05-17 NOTE — Telephone Encounter (Signed)
RETURN CALL: Voicemail - Detailed Message      SUBJECT:  Appointment Request     REASON FOR VISIT: bone protrustion, per referral  PREFERRED DATE/TIME: will discuss with clinic  REASON UNABLE TO APPOINT: Patient calling to schedule referral and would also like the billing code for the initial consultation.  CCR unable to assist.  Please contact directly.

## 2023-05-19 ENCOUNTER — Telehealth (HOSPITAL_BASED_OUTPATIENT_CLINIC_OR_DEPARTMENT_OTHER): Payer: Self-pay

## 2023-05-19 NOTE — Telephone Encounter (Signed)
RETURN CALL: MyChart reply      SUBJECT:  General Message     MESSAGE: Patient has initial consult scheduled for 10/25 and wanted to get the billing coding for consult to be able to confirm with insurance prior to appt. Thank you

## 2023-06-04 ENCOUNTER — Ambulatory Visit: Admitting: Oral & Maxillofacial Surgery

## 2023-07-15 ENCOUNTER — Telehealth (HOSPITAL_BASED_OUTPATIENT_CLINIC_OR_DEPARTMENT_OTHER): Payer: Self-pay

## 2023-07-15 NOTE — Telephone Encounter (Signed)
 Left detailed message for patient to call [phone] to discuss 12/10 appt with Dr Craige Cotta. [employer] denied PA.

## 2023-07-20 ENCOUNTER — Ambulatory Visit: Admitting: Obstetrics/Gynecology

## 2023-08-31 ENCOUNTER — Ambulatory Visit: Admitting: Obstetrics/Gynecology

## 2023-09-01 ENCOUNTER — Other Ambulatory Visit (HOSPITAL_BASED_OUTPATIENT_CLINIC_OR_DEPARTMENT_OTHER): Payer: Self-pay | Admitting: Gynecology

## 2023-09-01 DIAGNOSIS — N941 Unspecified dyspareunia: Secondary | ICD-10-CM

## 2023-09-01 DIAGNOSIS — M6289 Other specified disorders of muscle: Secondary | ICD-10-CM

## 2023-09-01 DIAGNOSIS — N809 Endometriosis, unspecified: Secondary | ICD-10-CM

## 2023-09-01 MED ORDER — MYFEMBREE 40-1-0.5 MG OR TABS
1.0000 | ORAL_TABLET | Freq: Every day | ORAL | 12 refills | Status: DC
Start: 2023-09-01 — End: 2024-06-14

## 2023-09-01 NOTE — Telephone Encounter (Signed)
Last office visit was 09/14/2022  No future appointments scheduled  Refill pended

## 2023-09-15 ENCOUNTER — Encounter (HOSPITAL_BASED_OUTPATIENT_CLINIC_OR_DEPARTMENT_OTHER): Payer: Self-pay | Admitting: Gynecology

## 2023-09-15 ENCOUNTER — Telehealth (HOSPITAL_BASED_OUTPATIENT_CLINIC_OR_DEPARTMENT_OTHER): Payer: Self-pay | Admitting: Gynecology

## 2023-09-15 NOTE — Telephone Encounter (Signed)
Referral received from [facility] for patient to continue to see PR.  Gyn referral saved to patient's media. LVM for patient to call back and schedule follow-up if appointment is needed.   CMc

## 2023-11-02 ENCOUNTER — Encounter (HOSPITAL_BASED_OUTPATIENT_CLINIC_OR_DEPARTMENT_OTHER): Payer: Self-pay

## 2023-11-02 ENCOUNTER — Ambulatory Visit (HOSPITAL_BASED_OUTPATIENT_CLINIC_OR_DEPARTMENT_OTHER): Admitting: Student in an Organized Health Care Education/Training Program

## 2023-11-02 ENCOUNTER — Ambulatory Visit
Admission: RE | Admit: 2023-11-02 | Discharge: 2023-11-02 | Disposition: A | Discharge status: Home / Self Care | Source: Ambulatory Visit | Attending: Diagnostic Radiology | Admitting: Diagnostic Radiology

## 2023-11-02 ENCOUNTER — Encounter (HOSPITAL_BASED_OUTPATIENT_CLINIC_OR_DEPARTMENT_OTHER): Payer: Self-pay | Admitting: Student in an Organized Health Care Education/Training Program

## 2023-11-02 VITALS — BP 116/77 | HR 85 | Temp 97.6°F | Ht 66.0 in | Wt 182.0 lb

## 2023-11-02 DIAGNOSIS — M25511 Pain in right shoulder: Secondary | ICD-10-CM

## 2023-11-02 DIAGNOSIS — M25512 Pain in left shoulder: Secondary | ICD-10-CM

## 2023-11-02 DIAGNOSIS — M7918 Myalgia, other site: Secondary | ICD-10-CM | POA: Insufficient documentation

## 2023-11-02 DIAGNOSIS — G8929 Other chronic pain: Secondary | ICD-10-CM

## 2023-11-02 NOTE — Patient Instructions (Signed)
 It was great to see you today!  - Please schedule a follow-up visit with me in about 2 months.  - I am writing you a physical therapy referral that you can take to your preferred physical therapist so that she can work on your shoulders.  - If you need some additional pain relief, you could try taking a more consistent dose of ibuprofen for about 7 to 10 days.  You can take 600 mg by mouth 3 times per day with food.  If it upsets your stomach please stop taking it.  After 7 to 10 days and you can just take it intermittently as needed.  - Please come back to see me sooner if anything is not going well, or things are getting worse.  - You could also proceed with scheduling the MRIs of your shoulders so that we can get more information in case the physical therapy is not as effective as we hoped.        If you have any questions or concerns, please send [place] a message or give [place] a call!  You can set up a MyChart account if you do not yet have one.  Our direct clinic contact number for our patients is [phone], option 2.      We appreciate trusting your care to the [facility] [employer].You may receive a survey in the mail about your experience in our clinic today and about the care my team and I provided for you.  Your feedback is very important to me.  Please take a few moments to complete and return the survey. Your responses are used so we can keep track of how to improve patient care and more importantly how to serve you better.

## 2023-11-02 NOTE — Progress Notes (Signed)
 [facility] NOTE    ID and Chief Complaint   Christina Brooks comes to clinic today primarily to discuss b/l shoulder pain.    History of Present Complaint(s)     Didn't realize she was having shoulder issues, thought it was a continuation of the back issues she had in the [employer].  Saw a massage therapist who told her she can't diagnose anything but wondered if she might have frozen shoulder.      Shoulders have been an issue for several years.  Feels a crunchiness in her shoulders.  Can't open up her chest well, often has a hunched rounded position.  Has pain around trapezius and under shoulder blade.  Feels this more on the R side.  When she has flare ups the R side is worse, but both are bothersome.  Has noticed she doesn't have rotation, ROM is limited.  Feels ROM limitation has been present for at least one year.     Got order for b/l shoulder MRI's but then insurance changed carrier, so hasn't yet pursued this but is happy to do so if we think helpful.      Tried massage therapy for the shoulders, provided temporary relief.  No PT for the shoulders.  Sees a PT for her back but not her shoulders b/c needs formal referral.  Tries to avoid pain meds, will occasionally take ibuprofen with a bad flare up.      Sometimes has a sensation like something in the arms is pinched.  Has not woken up with her arms numb for the past 1.5 yrs.  However now if she does a specific motion she'll sometimes have some tingling.  Feels this in her forearm and hand, more concentrated around her wrist/palm rather than her fingers.  Thinks all fingers are involved but not as intense.      RHD.      Was at the [facility] pain clinic yesterday and she had a migraine botox treatment, this included some injections into the b/l trapezius muscles.      Started on PTSD med with military, no longer on that med.  After that developed neck/TMJ/traps tightness and wonders if this could be related.      Christina Brooks  Begley's activities and exercise include gardening, working on gradual run progression. She was previously in [employer], now retired.     Relevant medical history:   PTSD  Migraines  Endometriosis - currently in medical induced menopause      Physical Exam   BP 116/77   Pulse 85   Temp 36.4 C (Temporal)   Ht 5\' 6"  (1.676 m)   Wt 82.6 kg (182 lb)   SpO2 100%   BMI 29.38 kg/m   GENERAL: No distress. Pleasant and cooperative with exam.  HEENT: NC/AT. Anicteric sclerae.  RESPIRATORY: Breathing comfortably on RA.  NEUROLOGICAL: No gross cranial nerve or mental status deficits.  PSYCHIATRIC: Normal mood and affect.  SKIN: No skin breakdown, rash, or erythema in the examined areas.  MSK: Mild tenderness to palpation over the midline cervical spine (consistent with baseline per patient).  Neck range of motion intact with flexion, extension, lateral flexion, and rotation, however movement in all directions causes generalized discomfort around the neck.  Negative Spurling's maneuver bilaterally.  2+ radial pulses bilaterally, sensation intact to light touch in the bilateral upper extremities, motor nerve function intact in the radial, median, and ulnar nerve distributions.  Both shoulders are diffusely tender to  palpation over the clavicle, biceps tendon, posterior glenohumeral joint, parascapular muscles, and trapezius.  Both shoulders have a forward rolling posture.  Scapular dyskinesis appreciated.  Active range of motion with abduction to 175 (pain at endrange), forward flexion to 175 (pain at endrange), external rotation to 50 degrees bilaterally, internal rotation to T8 10 on the right and T10 on the left.  Passive range of motion demonstrates fairly good internal and external rotation.  Right shoulder with positive Hawkins test, positive Neer's test, positive Speed test, positive Yergason test, equivocal O'Brien's.  Left shoulder with positive Hawkins test, positive Neer's test, positive Speed test,  positive Yergason test, negative O'Brien's.  Strength is 5 out of 5 with resisted strength testing of the deltoid, supraspinatus, infraspinatus/teres minor, subscapularis, biceps, and triceps.  Strength testing is painful with abduction, empty can test, and internal and external rotation.      Labs/X-ray/Other   X-rays with no joint space narrowing, osteophytosis, acute fracture, or malalignment on my read; please see final radiology interpretation.    _____________________________________________________________________________    Assessment   Christina Brooks is a right-hand-dominant patient with a history of PTSD and chronic neck/back pain who is presenting with chronic bilateral shoulder pain.  The patient's presentation is not consistent with adhesive capsulitis as she had initially feared, but does suggest a myofascial source of her pain, as well as some rotator cuff tendinopathy and scapular dyskinesis.    Plan   We reviewed and discussed the nature of Christina Brooks's injury, possible diagnoses, and relevant anatomy and treatment options in the context of their activity level and employment.   -Referral to physical therapy.  - Okay to take ibuprofen 600 mg p.o. 3 times daily x 7 to 10 days to help decrease symptoms.  After this recommended taking it only intermittently as needed.  Counseled to take with food, and to discontinue due to some stomach upset.  - The patient asked about next steps if her shoulders are not improving with physical therapy, and that would likely include proceeding with an MRI.  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  If her shoulder pain is improving with physical therapy, then she can always cancel this imaging.  - Recommended follow-up with me in approximately 2 months.  Encouraged the patient to follow-up sooner for any new/worsening symptoms, or if she ends up proceeding  with an MRI before that time.    Christina Brooks agreed with the above plan and was given an opportunity to ask questions before they left clinic. I answered these questions to the best of my ability.        I spent a total of 45 minutes for the patient's care on the date of service.    --  Criss Rosales, MD, CAQSM  Associate Professor  [facility]  Sports Medicine Section

## 2023-12-28 ENCOUNTER — Ambulatory Visit: Admitting: Student in an Organized Health Care Education/Training Program

## 2024-01-06 ENCOUNTER — Telehealth (HOSPITAL_BASED_OUTPATIENT_CLINIC_OR_DEPARTMENT_OTHER): Payer: Self-pay | Admitting: Gynecology

## 2024-01-06 NOTE — Telephone Encounter (Signed)
 LVM for pt to call back and clarify request in appt notes for 7/14 appt with [facility] for "fertility testing" since [facility] does not offer fertility care ok to keep appt to discuss endometriosis.    CMc

## 2024-01-10 ENCOUNTER — Encounter (HOSPITAL_BASED_OUTPATIENT_CLINIC_OR_DEPARTMENT_OTHER): Payer: Self-pay | Admitting: Gynecology

## 2024-01-10 ENCOUNTER — Ambulatory Visit (HOSPITAL_BASED_OUTPATIENT_CLINIC_OR_DEPARTMENT_OTHER): Admitting: Gynecology

## 2024-01-10 ENCOUNTER — Ambulatory Visit: Attending: Gynecology | Admitting: Gynecology

## 2024-01-10 VITALS — BP 125/76 | HR 88 | Ht 66.0 in | Wt 169.0 lb

## 2024-01-10 DIAGNOSIS — Z124 Encounter for screening for malignant neoplasm of cervix: Secondary | ICD-10-CM | POA: Insufficient documentation

## 2024-01-10 DIAGNOSIS — N809 Endometriosis, unspecified: Secondary | ICD-10-CM | POA: Insufficient documentation

## 2024-01-10 DIAGNOSIS — Z1151 Encounter for screening for human papillomavirus (HPV): Secondary | ICD-10-CM | POA: Insufficient documentation

## 2024-01-10 NOTE — Progress Notes (Signed)
 Christina Brooks returns for follow up. Overall she is doing well, finding improvement in symptoms with Myfembree  ( with less SE than [place] ) and pelvic floor PT.    She is due for cervical cancer screening.    Mother tested negative for BRCA ( done due to MGM breast cancer- DCIS age 32)     She is not sure will ever seek pregnancy but at this time wishes to keep all options open.    Her past medical hx, past surgical hx, family hx, social history, current medications, and allergies have been updated and reviewed.    BP 125/76   Pulse 88   Ht 5\' 6"  (1.676 m)   Wt 76.7 kg (169 lb)   SpO2 98%   BMI 27.28 kg/m     Normal EGBUS Vag with normal mucosa/ moisture/ no lesions. Cervix anterior nullip appearing.  No CMT    (N80.9) Endometriosis  (primary encounter diagnosis)  Plan: Stable on current therapy- can continue for up to two years.    (Z12.4) Cervical cancer screening  Plan: Cervical Cancer Screening        Primary HPV screening and ASCCP cervical cancer screening guidelines reviewed.      RTC one year/ prn.    Christina Galeazzi, MD

## 2024-01-11 LAB — HPV REFLEX TO PAP
HPV 16 Genotype: NEGATIVE
HPV 18 Genotype: NEGATIVE
High Risk HPV Screening: NEGATIVE
Other High Risk HPV Genotype: NEGATIVE

## 2024-01-24 ENCOUNTER — Encounter (HOSPITAL_BASED_OUTPATIENT_CLINIC_OR_DEPARTMENT_OTHER): Payer: Self-pay | Admitting: Student in an Organized Health Care Education/Training Program

## 2024-01-25 ENCOUNTER — Ambulatory Visit
Attending: Student in an Organized Health Care Education/Training Program | Admitting: Student in an Organized Health Care Education/Training Program

## 2024-01-25 ENCOUNTER — Encounter (HOSPITAL_BASED_OUTPATIENT_CLINIC_OR_DEPARTMENT_OTHER): Payer: Self-pay | Admitting: Student in an Organized Health Care Education/Training Program

## 2024-01-25 VITALS — BP 115/74 | HR 91 | Temp 98.6°F | Ht 66.0 in | Wt 169.0 lb

## 2024-01-25 DIAGNOSIS — G8929 Other chronic pain: Secondary | ICD-10-CM | POA: Insufficient documentation

## 2024-01-25 DIAGNOSIS — M25512 Pain in left shoulder: Secondary | ICD-10-CM | POA: Insufficient documentation

## 2024-01-25 DIAGNOSIS — M25511 Pain in right shoulder: Secondary | ICD-10-CM | POA: Insufficient documentation

## 2024-01-25 NOTE — Progress Notes (Signed)
 [facility] NOTE    ID and Chief Complaint   Christina Brooks comes to clinic today primarily to follow up on her bilateral shoulder pain.    History of Present Complaint(s)     Overall shoulders 40% improved.  [facility].  She is very encouraged by her progress.    Still with trapezius spasm.  When she gets a flare up, feels like there's pain where things connect at the lateral aspect of the shoulder.  Gets knots underneath the shoulder blades.        Physical Exam   BP 115/74   Pulse 91   Temp 37 C (Temporal)   Ht 5' 6 (1.676 m)   Wt 76.7 kg (169 lb)   SpO2 100%   BMI 27.28 kg/m   GENERAL: No distress. Pleasant and cooperative with exam.  HEENT: NC/AT. Anicteric sclerae.  RESPIRATORY: Breathing comfortably on RA.  NEUROLOGICAL: No gross cranial nerve or mental status deficits.  PSYCHIATRIC: Normal mood and affect.  SKIN: No skin breakdown, rash, or erythema in the examined areas.  MSK: Mild tenderness to palpation over the posterior neck diffusely.  Full range of motion of the neck with flexion, extension, lateral flexion, and rotation.  This reproduces a stretching sensation over the anterior neck and some soreness over the posterior neck.  There is some tenderness palpation over the bilateral trapezius muscles, which also feel palpably tight.  Shoulder range of motion is symmetric with abduction to 175, forward flexion 175, external rotation to 50, and internal rotation to approximately T8 b/l.  No pain with range of motion.  Good strength with testing of the deltoid, supraspinatus, infraspinatus/teres minor, biceps, triceps, and subscapularis.  Overall the patient had generalized mild discomfort towards the end of the strength testing maneuvers, but not with anyone specific motion.  Right shoulder had a mildly positive Hawkins test, negative Neer's test, negative Speed test, negative Yergason test, negative O'Brien's test.   The left shoulder had a mildly positive Hawkins test and Neer's test, mildly positive Speed test, negative Yergason test, mildly positive O'Brien's test.  2+ radial pulses bilaterally, sensation intact to light touch in the bilateral upper extremities.    Labs/X-ray/Other   X-ray of bilateral shoulders 11/02/2023:  RIGHT SIDE:  No acute fracture or dislocation. Benign bone island within the humeral head. Joint spaces are preserved.     LEFT SIDE:  No acute fracture or dislocation. Joint spaces are preserved.    _____________________________________________________________________________    Assessment   Christina Brooks is following up on her bilateral shoulder pain and an overall clinical picture consistent with rotator cuff tendinopathy, scapular dyskinesis, and myofascial pain.  Overall symptoms are improving significantly with physical therapy.  The patient feels subjectively improved, and her physical exam today is also significantly better than at her last visit.    Plan   We reviewed and discussed the nature of Christina Brooks's course thus far in the context of their activity level and employment.   -Recommended continuing physical therapy.  - Follow-up with me as needed if symptoms stop improving, or if there are any new/worsening symptoms.  At that point we would reassess, and consider advanced imaging if needed.    Christina Brooks agreed with the above plan and was given an opportunity to ask questions before they left clinic. I answered these questions to the best of my ability.  I spent a total of 25 minutes for the patient's care on the date of service.    --  Alfonso Henderson, MD, CAQSM  Associate Professor  [facility] Medicine  Sports Medicine Section

## 2024-02-21 ENCOUNTER — Ambulatory Visit (HOSPITAL_BASED_OUTPATIENT_CLINIC_OR_DEPARTMENT_OTHER): Admitting: Gynecology

## 2024-06-13 ENCOUNTER — Encounter (HOSPITAL_BASED_OUTPATIENT_CLINIC_OR_DEPARTMENT_OTHER): Payer: Self-pay

## 2024-06-14 ENCOUNTER — Other Ambulatory Visit (HOSPITAL_BASED_OUTPATIENT_CLINIC_OR_DEPARTMENT_OTHER): Payer: Self-pay | Admitting: Obstetrics & Gynecology

## 2024-06-14 DIAGNOSIS — M6289 Other specified disorders of muscle: Secondary | ICD-10-CM

## 2024-06-14 DIAGNOSIS — N809 Endometriosis, unspecified: Secondary | ICD-10-CM

## 2024-06-14 DIAGNOSIS — N941 Unspecified dyspareunia: Secondary | ICD-10-CM

## 2024-06-14 MED ORDER — MYFEMBREE 40-1-0.5 MG OR TABS
1.0000 | ORAL_TABLET | Freq: Every day | ORAL | 3 refills | Status: AC
Start: 2024-06-14 — End: ?

## 2024-06-14 NOTE — Telephone Encounter (Signed)
 Refill request for MYFEMBREE  TABS 40/1/0.5MG      Last visit: 01/10/24    Last refill: 09/01/23

## 2024-07-18 ENCOUNTER — Ambulatory Visit: Attending: Gynecology | Admitting: Gynecology

## 2024-07-18 ENCOUNTER — Encounter (HOSPITAL_BASED_OUTPATIENT_CLINIC_OR_DEPARTMENT_OTHER): Payer: Self-pay | Admitting: Gynecology

## 2024-07-18 VITALS — BP 111/71 | HR 84 | Ht 66.0 in | Wt 164.0 lb

## 2024-07-18 DIAGNOSIS — N809 Endometriosis, unspecified: Secondary | ICD-10-CM

## 2024-07-18 NOTE — Progress Notes (Signed)
 Christina Brooks returns for follow up. She continues to have amenorrhea and no endo pain on myfembree  ( approaching the 2 year mark in Feb).  She is not planning for pregnancy at this time.    Her past medical hx, past surgical hx, family hx, social history, current medications, and allergies have been updated and reviewed.    BP 111/71   Pulse 84   Ht 5' 6 (1.676 m)   Wt 74.4 kg (164 lb)   SpO2 99%   BMI 26.47 kg/m     (N80.9) Endometriosis  (primary encounter diagnosis)  Plan: We discussed alternate medical therapy for endo suppression.  Norethindrone acetate 5 mg is very effective and usually maintains amenorrhea. At this point we can plan for this as next agent.  Dr. Gregg would be excellent choice for ongoing management in our clinic.    Avelina DELENA Erichsen, MD

## 2024-08-30 ENCOUNTER — Telehealth (HOSPITAL_BASED_OUTPATIENT_CLINIC_OR_DEPARTMENT_OTHER): Payer: Self-pay

## 2024-08-30 NOTE — Telephone Encounter (Signed)
 RETURN CALL: Voicemail - Detailed Message      SUBJECT:  Referral Request/Questions - Incoming     REASON FOR REFERRAL: Recurrent UTI  DATE SENT: 08/14/2024  REFERRING CLINIC/PROVIDER: Rawland Beat at [facility]  QUESTIONS: Patient is calling in to verify if referral has been received. Please call patient to confirm.

## 2024-08-31 NOTE — Telephone Encounter (Signed)
 Please re-direct to [facility] or [facility] for UTI

## 2024-12-13 ENCOUNTER — Ambulatory Visit: Admitting: Surgical

## 8316-10-08 DEATH — deceased
